# Patient Record
Sex: Female | Born: 1990 | Race: Black or African American | Hispanic: No | Marital: Single | Smoking: Never smoker
Health system: Southern US, Community
[De-identification: ages and names within clinical notes are randomized; demographics above are authoritative.]

## PROBLEM LIST (undated history)

## (undated) DIAGNOSIS — R87629 Unspecified abnormal cytological findings in specimens from vagina: Secondary | ICD-10-CM

## (undated) DIAGNOSIS — Z789 Other specified health status: Secondary | ICD-10-CM

## (undated) HISTORY — PX: NO PAST SURGERIES: SHX2092

## (undated) HISTORY — DX: Unspecified abnormal cytological findings in specimens from vagina: R87.629

## (undated) HISTORY — PX: INDUCED ABORTION: SHX677

---

## 2011-04-12 ENCOUNTER — Other Ambulatory Visit (HOSPITAL_COMMUNITY)
Admission: RE | Admit: 2011-04-12 | Discharge: 2011-04-12 | Disposition: A | Discharge status: Home / Self Care | Payer: Self-pay | Source: Ambulatory Visit | Attending: Family Medicine | Admitting: Family Medicine

## 2011-04-12 DIAGNOSIS — R87810 Cervical high risk human papillomavirus (HPV) DNA test positive: Secondary | ICD-10-CM | POA: Insufficient documentation

## 2011-04-12 DIAGNOSIS — Z01419 Encounter for gynecological examination (general) (routine) without abnormal findings: Secondary | ICD-10-CM | POA: Insufficient documentation

## 2013-08-22 ENCOUNTER — Encounter (HOSPITAL_COMMUNITY): Payer: Self-pay | Admitting: Emergency Medicine

## 2013-08-22 ENCOUNTER — Emergency Department (INDEPENDENT_AMBULATORY_CARE_PROVIDER_SITE_OTHER)
Admission: EM | Admit: 2013-08-22 | Discharge: 2013-08-22 | Disposition: A | Discharge status: Home / Self Care | Payer: Self-pay | Source: Home / Self Care | Attending: Emergency Medicine | Admitting: Emergency Medicine

## 2013-08-22 DIAGNOSIS — J039 Acute tonsillitis, unspecified: Secondary | ICD-10-CM

## 2013-08-22 LAB — POCT RAPID STREP A: Streptococcus, Group A Screen (Direct): NEGATIVE

## 2013-08-22 MED ORDER — AMOXICILLIN 500 MG PO CAPS: 500.0000 mg | ORAL_CAPSULE | Freq: Three times a day (TID) | ORAL | Status: DC

## 2013-08-22 NOTE — ED Provider Notes (Signed)
Chief Complaint:   Chief Complaint  Patient presents with  . Sore Throat    History of Present Illness:   Victoria Glass is a 22 year old female who has had a two-day history of sore throat, pain on swallowing, headache, subjective fever, sweats, and dry cough. She denies any nasal congestion, rhinorrhea, swollen glands, or GI symptoms. She's had no known exposure. She works as a Production assistant, radio.  Review of Systems:  Other than as noted above, the patient denies any of the following symptoms. Systemic:  No fever, chills, sweats, fatigue, myalgias, headache, or anorexia. Eye:  No redness, pain or drainage. ENT:  No earache, ear congestion, nasal congestion, sneezing, rhinorrhea, sinus pressure, sinus pain, or post nasal drip. Lungs:  No cough, sputum production, wheezing, shortness of breath, or chest pain. GI:  No abdominal pain, nausea, vomiting, or diarrhea. Skin:  No rash or itching.  PMFSH:  Past medical history, family history, social history, meds, allergies, and nurse's notes were reviewed.  There is no known exposure to strep or mono.  No prior history of step or mono.  The patient denies use of tobacco.   Physical Exam:   Vital signs:  BP 111/72  Pulse 89  Temp(Src) 100.2 F (37.9 C) (Oral)  Resp 21  SpO2 97%  LMP 08/22/2013 General:  Alert, in no distress. Eye:  No conjunctival injection or drainage. Lids were normal. ENT:  TMs and canals were normal, without erythema or inflammation.  Nasal mucosa was clear and uncongested, without drainage.  Mucous membranes were moist.  Exam of pharynx tonsils were moderately enlarged, red, and had some small spots of white exudate.  There were no oral ulcerations or lesions. There was no bulging of the tonsillar pillars, and the uvula was midline. Neck:  Supple, no adenopathy, tenderness or mass. Lungs:  No respiratory distress.  Lungs were clear to auscultation, without wheezes, rales or rhonchi.  Breath sounds were clear and equal bilaterally.   Heart:  Regular rhythm, without gallops, murmers or rubs. Skin:  Clear, warm, and dry, without rash or lesions.  Labs:   Results for orders placed during the hospital encounter of 08/22/13  POCT RAPID STREP A ([facility] CARE ONLY)      Result Value Range   Streptococcus, Group A Screen (Direct) NEGATIVE  NEGATIVE   Assessment:  The encounter diagnosis was Tonsillitis.  There is no evidence of a peritonsillar abscess.    Plan:   1.  Meds:  The following meds were prescribed:   Discharge Medication List as of 08/22/2013  7:19 PM    START taking these medications   Details  amoxicillin (AMOXIL) 500 MG capsule Take 1 capsule (500 mg total) by mouth 3 (three) times daily., Starting 08/22/2013, Until Discontinued, Normal        2.  Patient Education/Counseling:  The patient was given appropriate handouts, self care instructions, and instructed in symptomatic relief, including hot saline gargles, throat lozenges, infectious precautions, and need to trade out toothbrush.    3.  Follow up:  The patient was told to follow up if no better in 3 to 4 days, if becoming worse in any way, and given some red flag symptoms such as difficulty swallowing or breathing which would prompt immediate return.  Follow up here as necessary.     Reuben Likes, MD 08/22/13 2114

## 2013-08-22 NOTE — ED Notes (Signed)
Pt c/o sore throat onset yest Sxs include: odynophagia, dry cough, felt warm today Denies: v/n/d Alert w/no signs of acute distress.

## 2013-08-24 LAB — CULTURE, GROUP A STREP

## 2013-08-26 ENCOUNTER — Telehealth (HOSPITAL_COMMUNITY): Payer: Self-pay | Admitting: *Deleted

## 2013-08-26 NOTE — ED Notes (Signed)
Throat culture: strep beta hemolytic, not group A.  Pt. adequately treated with Amoxicillin. I called pt. Pt. verified x 2 and given results.  Pt. told she is adequately treated and to finish all of antibiotics. Vassie Moselle 08/26/2013

## 2015-11-28 ENCOUNTER — Telehealth: Payer: Self-pay | Admitting: *Deleted

## 2015-11-28 NOTE — Telephone Encounter (Signed)
Patient contacted the office stating she has had her Nexplanon in for 3 years and is interested in having it removed. Attempted to contact the patient and left message for patient to call the office.

## 2015-12-11 ENCOUNTER — Ambulatory Visit: Payer: Self-pay | Admitting: Certified Nurse Midwife

## 2015-12-12 ENCOUNTER — Ambulatory Visit: Payer: Self-pay | Admitting: Certified Nurse Midwife

## 2015-12-16 ENCOUNTER — Ambulatory Visit: Payer: Self-pay | Admitting: Certified Nurse Midwife

## 2015-12-18 ENCOUNTER — Ambulatory Visit: Payer: Self-pay | Admitting: Certified Nurse Midwife

## 2015-12-23 ENCOUNTER — Ambulatory Visit: Payer: Self-pay | Admitting: Certified Nurse Midwife

## 2015-12-26 ENCOUNTER — Ambulatory Visit: Payer: Self-pay | Admitting: Certified Nurse Midwife

## 2015-12-26 NOTE — Telephone Encounter (Signed)
Patient was originally scheduled for 12-26-15 for a Nexplanon Removal but was unable to keep this appointment due to her great grandfather being in the hospital. Patient has been rescheduled for 01-02-16.

## 2016-01-01 ENCOUNTER — Ambulatory Visit (INDEPENDENT_AMBULATORY_CARE_PROVIDER_SITE_OTHER): Payer: Managed Care, Other (non HMO) | Admitting: Certified Nurse Midwife

## 2016-01-01 ENCOUNTER — Encounter: Payer: Self-pay | Admitting: Certified Nurse Midwife

## 2016-01-01 VITALS — BP 113/72 | HR 81 | Temp 98.2°F | Wt 189.0 lb

## 2016-01-01 DIAGNOSIS — E669 Obesity, unspecified: Secondary | ICD-10-CM | POA: Insufficient documentation

## 2016-01-01 DIAGNOSIS — Z113 Encounter for screening for infections with a predominantly sexual mode of transmission: Secondary | ICD-10-CM

## 2016-01-01 DIAGNOSIS — Z01419 Encounter for gynecological examination (general) (routine) without abnormal findings: Secondary | ICD-10-CM

## 2016-01-01 NOTE — Progress Notes (Signed)
Patient ID: Victoria Glass, female   DOB: 05/16/1991, 25 y.o.   MRN: [id number]    Subjective:      Victoria Glass is a 25 y.o. female here for a routine exam.  Current complaints: none.  Has Nexplanon, desires removal was placed March 2014.  Desires to change birth control methods.  Is having irregular periods with Nexplanon lasting 7 days, denies any heavy bleeding or clots.  Desires to try [facility].  Currently sexually active.  Desires full STD screening.   Personal health questionnaire:  Is patient Ashkenazi Jewish, have a family history of breast and/or ovarian cancer: no Is there a family history of uterine cancer diagnosed at age < 48, gastrointestinal cancer, urinary tract cancer, family member who is a Personnel officer syndrome-associated carrier: no Is the patient overweight and hypertensive, family history of diabetes, personal history of gestational diabetes, preeclampsia or PCOS: yes Is patient over 64, have PCOS,  family history of premature CHD under age 108, diabetes, smoke, have hypertension or peripheral artery disease:  yes At any time, has a partner hit, kicked or otherwise hurt or frightened you?: no Over the past 2 weeks, have you felt down, depressed or hopeless?: no Over the past 2 weeks, have you felt little interest or pleasure in doing things?:no   Gynecologic History Patient's last menstrual period was 12/16/2015. Contraception: Nexplanon Last Pap: Unknown. Results were: normal according to the patient Last mammogram: N/A.   Obstetric History OB History  No data available    History reviewed. No pertinent past medical history.  History reviewed. No pertinent past surgical history.   Current outpatient prescriptions:  .  Etonogestrel (IMPLANON [place]), Inject into the skin., Disp: , Rfl:  Allergies  Allergen Reactions  . Citrus Other (See Comments)    Swelling of Tongue   . Parke Simmers Allergy]     Social History  Substance Use Topics  . Smoking status: Never  Smoker   . Smokeless tobacco: Not on file  . Alcohol Use: 0.0 oz/week    0 Standard drinks or equivalent per week     Comment: Socially     No family history on file.    Review of Systems  Constitutional: negative for fatigue and weight loss Respiratory: negative for cough and wheezing Cardiovascular: negative for chest pain, fatigue and palpitations Gastrointestinal: negative for abdominal pain and change in bowel habits Musculoskeletal:negative for myalgias Neurological: negative for gait problems and tremors Behavioral/Psych: negative for abusive relationship, depression Endocrine: negative for temperature intolerance   Genitourinary:negative for abnormal menstrual periods, genital lesions, hot flashes, sexual problems and vaginal discharge, +irregular bleeding with Nexplanon Integument/breast: negative for breast lump, breast tenderness, nipple discharge and skin lesion(s)    Objective:       BP 113/72 mmHg  Pulse 81  Temp(Src) 98.2 F (36.8 C)  Wt 189 lb (85.73 kg)  LMP 12/16/2015 General:   alert  Skin:   no rash or abnormalities  Lungs:   clear to auscultation bilaterally  Heart:   regular rate and rhythm, S1, S2 normal, no murmur, click, rub or gallop  Breasts:   normal without suspicious masses, skin or nipple changes or axillary nodes  Abdomen:  normal findings: no organomegaly, soft, non-tender and no hernia  Pelvis:  External genitalia: normal general appearance Urinary system: urethral meatus normal and bladder without fullness, nontender Vaginal: normal without tenderness, induration or masses Cervix: normal appearance Adnexa: normal bimanual exam Uterus: anteverted and non-tender, normal size   Lab Review Urine pregnancy  test Labs reviewed no Radiologic studies reviewed no  50% of 30 min visit spent on counseling and coordination of care.   Assessment:    Healthy female exam.   AUB with Nexplanon  Contraception management  STD screening  exam  Plan:    Education reviewed: calcium supplements, depression evaluation, low fat, low cholesterol diet, safe sex/STD prevention, self breast exams, skin cancer screening and weight bearing exercise. Contraception: Nexplanon. Follow up in: 1 day.   No orders of the defined types were placed in this encounter.   Orders Placed This Encounter  Procedures  . NuSwab Vaginitis Plus (VG+)  . HIV antibody (with reflex)  . Hepatitis B surface antigen  . RPR  . Hepatitis C antibody  . CBC with Differential/Platelet  . Comprehensive metabolic panel  . TSH  . HgB A1c  . HCV Antibody   Need to obtain previous records Possible management options include: another LARK Follow up in one month for string check from IUD insertion 01/02/16

## 2016-01-02 ENCOUNTER — Encounter: Payer: Self-pay | Admitting: Certified Nurse Midwife

## 2016-01-02 ENCOUNTER — Ambulatory Visit (INDEPENDENT_AMBULATORY_CARE_PROVIDER_SITE_OTHER): Payer: Managed Care, Other (non HMO) | Admitting: Certified Nurse Midwife

## 2016-01-02 VITALS — BP 104/66 | HR 76 | Temp 98.4°F | Wt 189.0 lb

## 2016-01-02 DIAGNOSIS — Z30430 Encounter for insertion of intrauterine contraceptive device: Secondary | ICD-10-CM

## 2016-01-02 DIAGNOSIS — Z3046 Encounter for surveillance of implantable subdermal contraceptive: Secondary | ICD-10-CM | POA: Insufficient documentation

## 2016-01-02 DIAGNOSIS — Z30014 Encounter for initial prescription of intrauterine contraceptive device: Secondary | ICD-10-CM | POA: Diagnosis not present

## 2016-01-02 DIAGNOSIS — Z3049 Encounter for surveillance of other contraceptives: Secondary | ICD-10-CM

## 2016-01-02 LAB — CBC WITH DIFFERENTIAL/PLATELET
BASOS ABS: 0 10*3/uL (ref 0.0–0.2)
BASOS: 1 %
EOS (ABSOLUTE): 0.1 10*3/uL (ref 0.0–0.4)
Eos: 2 %
HEMOGLOBIN: 13.7 g/dL (ref 11.1–15.9)
Hematocrit: 40.5 % (ref 34.0–46.6)
IMMATURE GRANS (ABS): 0 10*3/uL (ref 0.0–0.1)
Immature Granulocytes: 0 %
LYMPHS: 34 %
Lymphocytes Absolute: 1.7 10*3/uL (ref 0.7–3.1)
MCH: 30.6 pg (ref 26.6–33.0)
MCHC: 33.8 g/dL (ref 31.5–35.7)
MCV: 91 fL (ref 79–97)
MONOCYTES: 9 %
Monocytes Absolute: 0.5 10*3/uL (ref 0.1–0.9)
Neutrophils Absolute: 2.8 10*3/uL (ref 1.4–7.0)
Neutrophils: 54 %
Platelets: 289 10*3/uL (ref 150–379)
RBC: 4.47 x10E6/uL (ref 3.77–5.28)
RDW: 13.4 % (ref 12.3–15.4)
WBC: 5.2 10*3/uL (ref 3.4–10.8)

## 2016-01-02 LAB — COMPREHENSIVE METABOLIC PANEL
ALBUMIN: 4.1 g/dL (ref 3.5–5.5)
ALT: 22 IU/L (ref 0–32)
AST: 21 IU/L (ref 0–40)
Albumin/Globulin Ratio: 1.5 (ref 1.2–2.2)
Alkaline Phosphatase: 66 IU/L (ref 39–117)
BILIRUBIN TOTAL: 0.5 mg/dL (ref 0.0–1.2)
BUN/Creatinine Ratio: 13 (ref 8–20)
BUN: 11 mg/dL (ref 6–20)
CALCIUM: 9.2 mg/dL (ref 8.7–10.2)
CO2: 20 mmol/L (ref 18–29)
Chloride: 102 mmol/L (ref 96–106)
Creatinine, Ser: 0.85 mg/dL (ref 0.57–1.00)
GFR calc Af Amer: 111 mL/min/{1.73_m2} (ref >59)
GFR calc non Af Amer: 96 mL/min/{1.73_m2} (ref >59)
GLUCOSE: 86 mg/dL (ref 65–99)
Globulin, Total: 2.8 g/dL (ref 1.5–4.5)
POTASSIUM: 4.3 mmol/L (ref 3.5–5.2)
Sodium: 139 mmol/L (ref 134–144)
TOTAL PROTEIN: 6.9 g/dL (ref 6.0–8.5)

## 2016-01-02 LAB — HEMOGLOBIN A1C
ESTIMATED AVERAGE GLUCOSE: 100 mg/dL
Hgb A1c MFr Bld: 5.1 % (ref 4.8–5.6)

## 2016-01-02 LAB — HIV ANTIBODY (ROUTINE TESTING W REFLEX): HIV Screen 4th Generation wRfx: NONREACTIVE

## 2016-01-02 LAB — HCV ANTIBODY: HEP C VIRUS AB: 0.2 {s_co_ratio} (ref 0.0–0.9)

## 2016-01-02 LAB — HEPATITIS B SURFACE ANTIGEN: Hepatitis B Surface Ag: NEGATIVE

## 2016-01-02 LAB — TSH: TSH: 1.38 u[IU]/mL (ref 0.450–4.500)

## 2016-01-02 LAB — RPR: RPR Ser Ql: NONREACTIVE

## 2016-01-02 NOTE — Progress Notes (Signed)
Patient ID: Victoria Glass, female   DOB: 03-17-1991, 25 y.o.   MRN: [id number]  Procedure Note Removal of Nexplanon  Patient had Nexplano inserted in 2014. Desires removal today.   Reviewed risk and benefits of procedure. Alternative options discussed Patient reported understanding and agreed to continue.   The patient's left arm was palpated and the implant device located. The area was prepped with Hibiclens-Alcoholx3. The distal end of the device was palpated and 1 cc of 1% lidocaine with epinephrine was injected. A 2 mm incision was made. Any fibrotic tissue was carefully dissected away using blunt and/or sharp dissection. Over the tip and the tip was exposed, grasped with forcep and removed intact. Steri-strips and a sterile dressing were applied to the incision.   And a bandage applied and the arm was wrapped with gauze bandage.  The patient tolerated well.  Instructions:  The patient was instructed to remove the dressing in 24 hours and that some bruising is to be expected.  She was advised to use over the counter analgesics as needed for any pain at the site.  She is to keep the area dry for 24 hours and to call if her hand or arm becomes cold, numb, or blue.  Return visit:  Return in 4 weeks Patient plans IUD  Orvilla Cornwallachelle Denney CNM

## 2016-01-02 NOTE — Progress Notes (Signed)
Patient ID: Victoria Glass, female   DOB: July 07, 1991, 25 y.o.   MRN: [id number]  IUD Procedure Note   DIAGNOSIS: Desires long-term, reversible contraception   PROCEDURE: IUD placement Performing Provider: Orvilla Cornwallachelle Ruford Dudzinski CNM  Patient counseled prior to procedure. I explained risks and benefits of Kyleena IUD, reviewed alternative forms of contraception. Patient stated understanding and consented to continue with procedure.   LMP: unknown, Nexplanon Pregnancy Test: Negative Lot #: [id number] Expiration Date: March 2018   IUD type: [   ] Mirena   [   ] Paraguard  [   ] Skyla   [X]   Kyleena  PROCEDURE:  Timeout procedure was performed to ensure right patient and right site.  A bimanual exam was performed to determine the position of the uterus, retroverted. The speculum was placed. The vagina and cervix was sterilized in the usual manner and sterile technique was maintained throughout the course of the procedure. A single toothed tenaculum was applied to the posterior lip of the cervix and gentle traction applied. The depth of the uterus was sounded to 8 cm. With gentle traction on the tenaculum, the IUD was inserted to the appropriate depth and inserted without difficulty.  The string was cut to an estimated 4 cm length. Bleeding was minimal. The patient tolerated the procedure well.   Follow up: The patient tolerated the procedure well without complications.  Standard post-procedure care is explained and return precautions are given.  Orvilla Cornwallachelle Castella Lerner CNM

## 2016-01-04 LAB — NUSWAB VAGINITIS PLUS (VG+)
Atopobium vaginae: HIGH Score — AB
BVAB 2: HIGH Score — AB
CHLAMYDIA TRACHOMATIS, NAA: NEGATIVE
Candida albicans, NAA: NEGATIVE
Candida glabrata, NAA: NEGATIVE
Megasphaera 1: HIGH Score — AB
NEISSERIA GONORRHOEAE, NAA: NEGATIVE
Trich vag by NAA: NEGATIVE

## 2016-01-06 ENCOUNTER — Other Ambulatory Visit: Payer: Self-pay | Admitting: Certified Nurse Midwife

## 2016-01-06 DIAGNOSIS — B9689 Other specified bacterial agents as the cause of diseases classified elsewhere: Secondary | ICD-10-CM

## 2016-01-06 DIAGNOSIS — N760 Acute vaginitis: Principal | ICD-10-CM

## 2016-01-06 LAB — PAP IG W/ RFLX HPV ASCU: PAP Smear Comment: 0

## 2016-01-06 MED ORDER — METRONIDAZOLE 500 MG PO TABS: 500.0000 mg | ORAL_TABLET | Freq: Two times a day (BID) | ORAL | Status: DC

## 2016-02-03 ENCOUNTER — Ambulatory Visit: Payer: Managed Care, Other (non HMO) | Admitting: Certified Nurse Midwife

## 2017-08-22 ENCOUNTER — Ambulatory Visit (INDEPENDENT_AMBULATORY_CARE_PROVIDER_SITE_OTHER): Payer: 59 | Admitting: Certified Nurse Midwife

## 2017-08-22 ENCOUNTER — Encounter: Payer: Self-pay | Admitting: *Deleted

## 2017-08-22 ENCOUNTER — Encounter: Payer: Self-pay | Admitting: Certified Nurse Midwife

## 2017-08-22 ENCOUNTER — Other Ambulatory Visit: Payer: Self-pay

## 2017-08-22 VITALS — BP 111/68 | HR 81 | Ht 63.0 in | Wt 186.5 lb

## 2017-08-22 DIAGNOSIS — Z113 Encounter for screening for infections with a predominantly sexual mode of transmission: Secondary | ICD-10-CM | POA: Diagnosis not present

## 2017-08-22 DIAGNOSIS — Z124 Encounter for screening for malignant neoplasm of cervix: Secondary | ICD-10-CM

## 2017-08-22 DIAGNOSIS — Z30432 Encounter for removal of intrauterine contraceptive device: Secondary | ICD-10-CM | POA: Diagnosis not present

## 2017-08-22 NOTE — Progress Notes (Signed)
Presents for IUD removal, plans to use Condoms.

## 2017-08-22 NOTE — Progress Notes (Signed)
    GYNECOLOGY OFFICE PROCEDURE NOTE  Victoria Glass is a 26 y.o. No obstetric history on file. here for Liletta IUD removal. No GYN concerns.  Last pap smear was on 01/01/16 and was normal.  IUD Removal  Patient identified, informed consent performed, consent signed.  Patient was in the dorsal lithotomy position, normal external genitalia was noted.  A speculum was placed in the patient's vagina, normal discharge was noted, no lesions. The cervix was visualized, no lesions, no abnormal discharge.  The strings of the IUD were grasped and pulled using ring forceps. The IUD was removed in its entirety. Kyleena.  Patient tolerated the procedure well.    Patient will use condoms for contraception/ does not plan for pregnancy encouraged to, take PNV and folic acid.  Routine preventative health maintenance measures emphasized.  STD testing and pap smear obtained today.    Orvilla Cornwallachelle Denney, CNM [facility]

## 2017-08-22 NOTE — Addendum Note (Signed)
Addended by: Dalphine HandingGARDNER, Esdras Delair L on: 08/22/2017 04:49 PM   Modules accepted: Orders

## 2017-08-23 LAB — CERVICOVAGINAL ANCILLARY ONLY
Bacterial vaginitis: POSITIVE — AB
Candida vaginitis: NEGATIVE
Chlamydia: NEGATIVE
Neisseria Gonorrhea: NEGATIVE
TRICH (WINDOWPATH): NEGATIVE

## 2017-08-24 LAB — CYTOLOGY - PAP

## 2017-08-29 ENCOUNTER — Other Ambulatory Visit: Payer: Self-pay | Admitting: Certified Nurse Midwife

## 2017-08-29 DIAGNOSIS — R87612 Low grade squamous intraepithelial lesion on cytologic smear of cervix (LGSIL): Secondary | ICD-10-CM

## 2017-08-29 DIAGNOSIS — B9689 Other specified bacterial agents as the cause of diseases classified elsewhere: Secondary | ICD-10-CM

## 2017-08-29 DIAGNOSIS — N760 Acute vaginitis: Principal | ICD-10-CM

## 2017-08-29 MED ORDER — METRONIDAZOLE 500 MG PO TABS: 500.0000 mg | ORAL_TABLET | Freq: Two times a day (BID) | ORAL | 0 refills | Status: DC

## 2018-10-03 ENCOUNTER — Emergency Department (HOSPITAL_COMMUNITY)
Admission: EM | Admit: 2018-10-03 | Discharge: 2018-10-03 | Disposition: A | Discharge status: Home / Self Care | Payer: BLUE CROSS/BLUE SHIELD | Attending: Emergency Medicine | Admitting: Emergency Medicine

## 2018-10-03 ENCOUNTER — Encounter (HOSPITAL_COMMUNITY): Payer: Self-pay | Admitting: *Deleted

## 2018-10-03 ENCOUNTER — Other Ambulatory Visit: Payer: Self-pay

## 2018-10-03 DIAGNOSIS — K047 Periapical abscess without sinus: Secondary | ICD-10-CM | POA: Insufficient documentation

## 2018-10-03 DIAGNOSIS — O9989 Other specified diseases and conditions complicating pregnancy, childbirth and the puerperium: Secondary | ICD-10-CM | POA: Diagnosis not present

## 2018-10-03 DIAGNOSIS — Z3A01 Less than 8 weeks gestation of pregnancy: Secondary | ICD-10-CM | POA: Diagnosis not present

## 2018-10-03 MED ORDER — AMOXICILLIN 500 MG PO CAPS
500.0000 mg | ORAL_CAPSULE | Freq: Once | ORAL | Status: AC
  Administered 2018-10-03: 500 mg via ORAL
  Filled 2018-10-03: qty 1

## 2018-10-03 MED ORDER — AMOXICILLIN 500 MG PO CAPS: 500.0000 mg | ORAL_CAPSULE | Freq: Three times a day (TID) | ORAL | 0 refills | Status: DC

## 2018-10-03 NOTE — ED Provider Notes (Signed)
[facility]-EMERGENCY DEPT Provider Note   CSN: [id number] Arrival date & time: 10/03/18  1918     History   Chief Complaint Chief Complaint  Patient presents with  . Dental Pain    HPI Victoria Glass is a 27 y.o. female who presents to the ED with dental pain. Patient reports she is about [redacted] weeks pregnant.   HPI  History reviewed. No pertinent past medical history.  Patient Active Problem List   Diagnosis Date Noted  . Low grade squamous intraepith lesion on cytologic smear cervix (lgsil) 08/29/2017  . Obese 01/01/2016  . Well woman exam 01/01/2016    History reviewed. No pertinent surgical history.   OB History    Gravida  1   Para      Term      Preterm      AB      Living        SAB      TAB      Ectopic      Multiple      Live Births               Home Medications    Prior to Admission medications   Medication Sig Start Date End Date Taking? Authorizing Provider  acetaminophen (TYLENOL) 500 MG tablet Take 500 mg by mouth every 6 (six) hours as needed for mild pain or moderate pain.   Yes [provider]  benzocaine (HURRICAINE) 20 % GEL Use as directed 1 application in the mouth or throat 3 (three) times daily as needed (pain).   Yes [provider]  ibuprofen (ADVIL,MOTRIN) 200 MG tablet Take 400 mg by mouth every 6 (six) hours as needed for mild pain or moderate pain.   Yes [provider]  amoxicillin (AMOXIL) 500 MG capsule Take 1 capsule (500 mg total) by mouth 3 (three) times daily. 10/03/18   Janne NapoleonNeese, Corazon Nickolas M, NP    Family History No family history on file.  Social History Social History   Tobacco Use  . Smoking status: Never Smoker  . Smokeless tobacco: Never Used  Substance Use Topics  . Alcohol use: Yes    Alcohol/week: 0.0 standard drinks    Comment: Socially   . Drug use: Not Currently    Types: Marijuana    Comment: occassionally     Allergies   Patient has no  known allergies.   Review of Systems Review of Systems  Constitutional: Negative for fever.  HENT: Positive for dental problem and facial swelling. Negative for trouble swallowing.   Genitourinary:       Pregnant  All other systems reviewed and are negative.    Physical Exam Updated Vital Signs BP 133/70 (BP Location: Left Arm)   Pulse 93   Temp 98.9 F (37.2 C) (Oral)   Resp 18   Ht 5\' 2"  (1.575 m)   Wt 81.2 kg   LMP 08/23/2018 (Exact Date)   SpO2 100%   BMI 32.74 kg/m   Physical Exam Vitals signs and nursing note reviewed.  Constitutional:      General: She is not in acute distress.    Appearance: She is well-developed.  HENT:     Head: Normocephalic.     Right Ear: Tympanic membrane normal.     Left Ear: Tympanic membrane normal.     Nose: Nose normal.     Mouth/Throat:     Mouth: Mucous membranes are moist.     Dentition: Dental  tenderness present.      Comments: Left lower first molar tender on exam, there is swelling and erythema of the gum surrounding the tooth.  Eyes:     Conjunctiva/sclera: Conjunctivae normal.  Neck:     Musculoskeletal: Neck supple.  Cardiovascular:     Rate and Rhythm: Normal rate.  Pulmonary:     Effort: Pulmonary effort is normal.  Musculoskeletal: Normal range of motion.  Lymphadenopathy:     Cervical: Cervical adenopathy present.  Skin:    General: Skin is warm and dry.  Neurological:     Mental Status: She is alert and oriented to person, place, and time.  Psychiatric:        Mood and Affect: Mood normal.      ED Treatments / Results  Labs (all labs ordered are listed, but only abnormal results are displayed) Labs Reviewed - No data to display  Radiology No results found.  Procedures Procedures (including critical care time)  Medications Ordered in ED Medications  amoxicillin (AMOXIL) capsule 500 mg (500 mg Oral Given 10/03/18 2019)     Initial Impression / Assessment and Plan / ED Course  I have  reviewed the triage vital signs and the nursing notes. Patient with toothache.  No gross abscess.  Exam unconcerning for Ludwig's angina or spread of infection.  Will treat with Amoxil. Urged patient to follow-up with dentist.  Instructed patient to take the antibiotics and tylenol only since she is pregnant.  Final Clinical Impressions(s) / ED Diagnoses   Final diagnoses:  Dental infection    ED Discharge Orders         Ordered    amoxicillin (AMOXIL) 500 MG capsule  3 times daily     10/03/18 2009           Kerrie Buffaloeese, Anisa Leanos [place], [place] 10/03/18 2023    Terrilee FilesButler, Michael C, MD 10/04/18 1320

## 2018-10-03 NOTE — Discharge Instructions (Signed)
Take the antibiotic for infection and take tylenol as needed for pain. Call the OB office to schedule an appointment for pregnancy conformation and to start prenatal care.

## 2018-10-03 NOTE — ED Triage Notes (Signed)
Pt c/o left lower mouth pain x 2 days.  No decay noted to left lower back teeth.

## 2018-10-06 ENCOUNTER — Ambulatory Visit (INDEPENDENT_AMBULATORY_CARE_PROVIDER_SITE_OTHER): Payer: BLUE CROSS/BLUE SHIELD

## 2018-10-06 VITALS — BP 125/73 | HR 100 | Ht 63.0 in | Wt 179.2 lb

## 2018-10-06 DIAGNOSIS — Z3201 Encounter for pregnancy test, result positive: Secondary | ICD-10-CM

## 2018-10-06 LAB — POCT URINE PREGNANCY: Preg Test, Ur: POSITIVE — AB

## 2018-10-06 NOTE — Progress Notes (Signed)
[name] presents today for UPT. She has no unusual complaints.  LMP:08/23/18  EDD: 05/30/2019    OBJECTIVE: Appears well, in no apparent distress.  OB History    Gravida  1   Para      Term      Preterm      AB      Living        SAB      TAB      Ectopic      Multiple      Live Births             Home UPT Result: POSITIVE  In-Office UPT result: POSITIVE  I have reviewed the patient's medical, obstetrical, social, and family histories, and medications.   ASSESSMENT: Positive pregnancy test  PLAN Prenatal care to be completed at: [facility]

## 2018-11-02 ENCOUNTER — Ambulatory Visit (INDEPENDENT_AMBULATORY_CARE_PROVIDER_SITE_OTHER): Payer: BLUE CROSS/BLUE SHIELD | Admitting: Certified Nurse Midwife

## 2018-11-02 ENCOUNTER — Encounter: Payer: Self-pay | Admitting: Certified Nurse Midwife

## 2018-11-02 VITALS — BP 124/69 | HR 97 | Wt 182.0 lb

## 2018-11-02 DIAGNOSIS — Z113 Encounter for screening for infections with a predominantly sexual mode of transmission: Secondary | ICD-10-CM

## 2018-11-02 DIAGNOSIS — Z124 Encounter for screening for malignant neoplasm of cervix: Secondary | ICD-10-CM

## 2018-11-02 DIAGNOSIS — O99210 Obesity complicating pregnancy, unspecified trimester: Secondary | ICD-10-CM

## 2018-11-02 DIAGNOSIS — Z23 Encounter for immunization: Secondary | ICD-10-CM | POA: Diagnosis not present

## 2018-11-02 DIAGNOSIS — Z3481 Encounter for supervision of other normal pregnancy, first trimester: Secondary | ICD-10-CM | POA: Diagnosis not present

## 2018-11-02 DIAGNOSIS — Z3480 Encounter for supervision of other normal pregnancy, unspecified trimester: Secondary | ICD-10-CM

## 2018-11-02 DIAGNOSIS — O99211 Obesity complicating pregnancy, first trimester: Secondary | ICD-10-CM

## 2018-11-02 NOTE — Patient Instructions (Signed)
Influenza (Flu) Vaccine (Inactivated or Recombinant): What You Need to Know  1. Why get vaccinated?  Influenza vaccine can prevent influenza (flu).  Flu is a contagious disease that spreads around the [place] every year, usually between October and May. Anyone can get the flu, but it is more dangerous for some people. Infants and young children, people 28 years of age and older, pregnant women, and people with certain health conditions or a weakened immune system are at greatest risk of flu complications.  Pneumonia, bronchitis, sinus infections and ear infections are examples of flu-related complications. If you have a medical condition, such as heart disease, cancer or diabetes, flu can make it worse.  Flu can cause fever and chills, sore throat, muscle aches, fatigue, cough, headache, and runny or stuffy nose. Some people may have vomiting and diarrhea, though this is more common in children than adults.  Each year thousands of people in the [place] die from flu, and many more are hospitalized. Flu vaccine prevents millions of illnesses and flu-related visits to the doctor each year.  2. Influenza vaccine  CDC recommends everyone 6 months of age and older get vaccinated every flu season. Children 6 months through 8 years of age may need 2 doses during a single flu season. Everyone else needs only 1 dose each flu season.  It takes about 2 weeks for protection to develop after vaccination.  There are many flu viruses, and they are always changing. Each year a new flu vaccine is made to protect against three or four viruses that are likely to cause disease in the upcoming flu season. Even when the vaccine doesn't exactly match these viruses, it may still provide some protection.  Influenza vaccine does not cause flu.  Influenza vaccine may be given at the same time as other vaccines.  3. Talk with your health care provider  Tell your vaccine provider if the person getting the vaccine:  · Has had an  allergic reaction after a previous dose of influenza vaccine, or has any severe, life-threatening allergies.  · Has ever had Guillain-Barré Syndrome (also called GBS).  In some cases, your health care provider may decide to postpone influenza vaccination to a future visit.  People with minor illnesses, such as a cold, may be vaccinated. People who are moderately or severely ill should usually wait until they recover before getting influenza vaccine.  Your health care provider can give you more information.  4. Risks of a vaccine reaction  · Soreness, redness, and swelling where shot is given, fever, muscle aches, and headache can happen after influenza vaccine.  · There may be a very small increased risk of Guillain-Barré Syndrome (GBS) after inactivated influenza vaccine (the flu shot).  Young children who get the flu shot along with pneumococcal vaccine (PCV13), and/or DTaP vaccine at the same time might be slightly more likely to have a seizure caused by fever. Tell your health care provider if a child who is getting flu vaccine has ever had a seizure.  People sometimes faint after medical procedures, including vaccination. Tell your provider if you feel dizzy or have vision changes or ringing in the ears.  As with any medicine, there is a very remote chance of a vaccine causing a severe allergic reaction, other serious injury, or death.  5. What if there is a serious problem?  An allergic reaction could occur after the vaccinated person leaves the clinic. If you see signs of a severe allergic reaction (hives, swelling   of the face and throat, difficulty breathing, a fast heartbeat, dizziness, or weakness), call 9-1-1 and get the person to the nearest hospital.  For other signs that concern you, call your health care provider.  Adverse reactions should be reported to the Vaccine Adverse Event Reporting System (VAERS). Your health care provider will usually file this report, or you can do it yourself. Visit the  VAERS website at www.vaers.hhs.gov or call 1-800-822-7967.VAERS is only for reporting reactions, and VAERS staff do not give medical advice.  6. The National Vaccine Injury Compensation Program  The National Vaccine Injury Compensation Program (VICP) is a federal program that was created to compensate people who may have been injured by certain vaccines. Visit the VICP website at www.hrsa.gov/vaccinecompensation or call 1-800-338-2382 to learn about the program and about filing a claim. There is a time limit to file a claim for compensation.  7. How can I learn more?  · Ask your healthcare provider.  · Call your local or state health department.  · Contact the Centers for Disease Control and Prevention (CDC):  ? Call 1-800-232-4636 (1-800-CDC-INFO) or  ? Visit CDC's www.cdc.gov/flu  Vaccine Information Statement (Interim) Inactivated Influenza Vaccine (05/25/2018)  This information is not intended to replace advice given to you by your health care provider. Make sure you discuss any questions you have with your health care provider.  Document Released: 07/22/2006 Document Revised: 05/29/2018 Document Reviewed: 05/29/2018  Elsevier Interactive Patient Education © 2019 [employer]

## 2018-11-02 NOTE — Progress Notes (Signed)
Subjective:   Victoria Glass is a 28 y.o. G2P0010 at 707w1d by LMP being seen today for her first obstetrical visit.  Her obstetrical history is significant for obesity. Patient does intend to breast feed. Pregnancy history fully reviewed.  Patient reports no complaints.  HISTORY: OB History  Gravida Para Term Preterm AB Living  2 0 0 0 1 0  SAB TAB Ectopic Multiple Live Births  0 1 0 0 0    # Outcome Date GA Lbr Len/2nd Weight Sex Delivery Anes PTL Lv  2 Current           1 TAB             Last pap smear was done 08/22/2017 and was abnormal - LSIL/+HPV  Past Medical History:  Diagnosis Date  . Vaginal Pap smear, abnormal    History reviewed. No pertinent surgical history. Family History  Problem Relation Age of Onset  . Drug abuse Maternal Aunt   . Alcohol abuse Maternal Uncle   . Hypertension Maternal Uncle   . Drug abuse Paternal Aunt   . Diabetes Maternal Grandmother   . Cancer Maternal Grandfather   . Heart disease Maternal Grandfather   . Hypertension Maternal Grandfather    Social History   Tobacco Use  . Smoking status: Never Smoker  . Smokeless tobacco: Never Used  Substance Use Topics  . Alcohol use: Yes    Alcohol/week: 0.0 standard drinks    Comment: occasionally  . Drug use: Not Currently    Types: Marijuana    Comment: last time   - 4 months ago   No Known Allergies Current Outpatient Medications on File Prior to Visit  Medication Sig Dispense Refill  . acetaminophen (TYLENOL) 500 MG tablet Take 500 mg by mouth every 6 (six) hours as needed for mild pain or moderate pain.    . Prenatal MV-Min-FA-Omega-3 (PRENATAL GUMMIES/DHA & FA) 0.4-32.5 MG CHEW Chew by mouth.    Marland Kitchen. ibuprofen (ADVIL,MOTRIN) 200 MG tablet Take 400 mg by mouth every 6 (six) hours as needed for mild pain or moderate pain.     No current facility-administered medications on file prior to visit.     Review of Systems Pertinent items noted in HPI and remainder of  comprehensive ROS otherwise negative.  Exam   Vitals:   11/02/18 1008  BP: 124/69  Pulse: 97  Weight: 182 lb (82.6 kg)   Fetal Heart Rate (bpm): 150  Uterus:   Uterus measuring above pubis, size of grapefruit   Pelvic Exam: Perineum: no hemorrhoids, normal perineum   Vulva: normal external genitalia, no lesions   Vagina:  normal mucosa, normal discharge   Cervix: no lesions and normal, pap smear done.    Adnexa: normal adnexa and no mass, fullness, tenderness   Bony Pelvis: average  System: General: well-developed, well-nourished female in no acute distress   Skin: normal coloration and turgor, no rashes   Neurologic: oriented, normal, negative, normal mood   Extremities: normal strength, tone, and muscle mass, ROM of all joints is normal   HEENT PERRLA, extraocular movement intact and sclera clear   Mouth/Teeth mucous membranes moist, pharynx normal without lesions and dental hygiene good   Neck supple and no masses   Cardiovascular: regular rate and rhythm   Respiratory:  no respiratory distress, normal breath sounds   Abdomen: soft, non-tender; bowel sounds normal; no masses,  no organomegaly   Bedside Ultrasound for FHR check: Patient informed that the ultrasound  is considered a limited obstetric ultrasound and is not intended to be a complete ultrasound exam.  Patient also informed that the ultrasound is not being completed with the intent of assessing for fetal or placental anomalies or any pelvic abnormalities.  Explained that the purpose of today's ultrasound is to assess for fetal heart rate.  Patient acknowledges the purpose of the exam and the limitations of the study.    FHR 150 by bedside US, ? Multiple gestation- family hx of twin gestation. Separated membrane seen by [place] on examination but unable to see second fetus.  Assessment:   Pregnancy: G2P0010 Patient Active Problem List   Diagnosis Date Noted  . Supervision of other normal pregnancy, antepartum 11/02/2018    . Obesity during pregnancy, antepartum 11/02/2018  . Low grade squamous intraepith lesion on cytologic smear cervix (lgsil) 08/29/2017  . Obese 01/01/2016  . Well woman exam 01/01/2016     Plan:  1. Supervision of other normal pregnancy, antepartum - Dating [place] (transvaginal) scheduled for possible multiple gestation or uterus abnormalities  - Routine prenatal care and anticipatory guidance - Culture, OB Urine - Hemoglobinopathy evaluation - Obstetric Panel, Including HIV - Cytology - PAP( [place]) - Prenatal MV-Min-FA-Omega-3 (PRENATAL GUMMIES/DHA & FA) 0.4-32.5 MG CHEW; Chew by mouth. - Genetic Screening - HgB A1c - US OB LESS THAN 14 WEEKS WITH OB TRANSVAGINAL; Future  2. Need for immunization against influenza - Flu Vaccine QUAD 36+ mos IM  3. Obesity during pregnancy, antepartum BMI 32.2  - early A1C obtained  - needs to start BASA at next prenatal appointment    Initial labs drawn. Continue prenatal vitamins. Genetic Screening discussed, NIPS: ordered. Ultrasound discussed; fetal anatomic survey: requested. Problem list reviewed and updated. The nature of  - [facility] with multiple MDs and other Advanced Practice Providers was explained to patient; also emphasized that residents, students are part of our team. Routine obstetric precautions reviewed. Return in about 4 weeks (around 11/30/2018) for ROB.    Sharyon Cable, CNM [facility] [employer], [facility]

## 2018-11-05 LAB — URINE CULTURE, OB REFLEX

## 2018-11-05 LAB — CULTURE, OB URINE

## 2018-11-07 LAB — CYTOLOGY - PAP
Chlamydia: NEGATIVE
Diagnosis: NEGATIVE
Neisseria Gonorrhea: NEGATIVE
Trichomonas: NEGATIVE

## 2018-11-07 LAB — OBSTETRIC PANEL, INCLUDING HIV
Antibody Screen: NEGATIVE
Basophils Absolute: 0 10*3/uL (ref 0.0–0.2)
Basos: 1 %
EOS (ABSOLUTE): 0.1 10*3/uL (ref 0.0–0.4)
Eos: 1 %
HIV Screen 4th Generation wRfx: NONREACTIVE
Hematocrit: 36.5 % (ref 34.0–46.6)
Hemoglobin: 12.6 g/dL (ref 11.1–15.9)
Hepatitis B Surface Ag: NEGATIVE
Immature Grans (Abs): 0 10*3/uL (ref 0.0–0.1)
Immature Granulocytes: 0 %
Lymphocytes Absolute: 1.6 10*3/uL (ref 0.7–3.1)
Lymphs: 32 %
MCH: 31.3 pg (ref 26.6–33.0)
MCHC: 34.5 g/dL (ref 31.5–35.7)
MCV: 91 fL (ref 79–97)
Monocytes Absolute: 0.5 10*3/uL (ref 0.1–0.9)
Monocytes: 9 %
Neutrophils Absolute: 3 10*3/uL (ref 1.4–7.0)
Neutrophils: 57 %
Platelets: 301 10*3/uL (ref 150–450)
RBC: 4.02 x10E6/uL (ref 3.77–5.28)
RDW: 12.6 % (ref 11.7–15.4)
RPR Ser Ql: NONREACTIVE
Rh Factor: POSITIVE
Rubella Antibodies, IGG: 4.04 index (ref >0.99)
WBC: 5.2 10*3/uL (ref 3.4–10.8)

## 2018-11-07 LAB — HEMOGLOBIN A1C
Est. average glucose Bld gHb Est-mCnc: 82 mg/dL
Hgb A1c MFr Bld: 4.5 % — ABNORMAL LOW (ref 4.8–5.6)

## 2018-11-07 LAB — HEMOGLOBINOPATHY EVALUATION
HGB C: 0 %
HGB S: 0 %
HGB VARIANT: 0 %
Hemoglobin A2 Quantitation: 2.6 % (ref 1.8–3.2)
Hemoglobin F Quantitation: 0 % (ref 0.0–2.0)
Hgb A: 97.4 % (ref 96.4–98.8)

## 2018-11-08 ENCOUNTER — Ambulatory Visit (HOSPITAL_COMMUNITY)
Admission: RE | Admit: 2018-11-08 | Discharge: 2018-11-08 | Disposition: A | Discharge status: Home / Self Care | Payer: BLUE CROSS/BLUE SHIELD | Source: Ambulatory Visit | Attending: Certified Nurse Midwife | Admitting: Certified Nurse Midwife

## 2018-11-08 ENCOUNTER — Other Ambulatory Visit: Payer: Self-pay | Admitting: Certified Nurse Midwife

## 2018-11-08 DIAGNOSIS — Z3480 Encounter for supervision of other normal pregnancy, unspecified trimester: Secondary | ICD-10-CM | POA: Diagnosis present

## 2018-11-30 ENCOUNTER — Ambulatory Visit (INDEPENDENT_AMBULATORY_CARE_PROVIDER_SITE_OTHER): Payer: BLUE CROSS/BLUE SHIELD | Admitting: Certified Nurse Midwife

## 2018-11-30 VITALS — BP 126/76 | HR 77 | Wt 182.1 lb

## 2018-11-30 DIAGNOSIS — O99212 Obesity complicating pregnancy, second trimester: Secondary | ICD-10-CM

## 2018-11-30 DIAGNOSIS — Z3A14 14 weeks gestation of pregnancy: Secondary | ICD-10-CM

## 2018-11-30 DIAGNOSIS — O99210 Obesity complicating pregnancy, unspecified trimester: Secondary | ICD-10-CM

## 2018-11-30 DIAGNOSIS — Z3480 Encounter for supervision of other normal pregnancy, unspecified trimester: Secondary | ICD-10-CM

## 2018-11-30 DIAGNOSIS — Z3482 Encounter for supervision of other normal pregnancy, second trimester: Secondary | ICD-10-CM

## 2018-11-30 NOTE — Progress Notes (Signed)
Patient reports feeling fetal flutter movements, denies pain. 

## 2018-12-01 NOTE — Progress Notes (Signed)
   PRENATAL VISIT NOTE  Subjective:  Victoria Glass is a 28 y.o. G2P0010 at [redacted]w[redacted]d being seen today for ongoing prenatal care.  She is currently monitored for the following issues for this low-risk pregnancy and has Obese; Well woman exam; Low grade squamous intraepith lesion on cytologic smear cervix (lgsil); Supervision of other normal pregnancy, antepartum; and Obesity during pregnancy, antepartum on their problem list.  Patient reports no complaints.  Contractions: Not present. Vag. Bleeding: None.  Movement: Present. Denies leaking of fluid.   The following portions of the patient's history were reviewed and updated as appropriate: allergies, current medications, past family history, past medical history, past social history, past surgical history and problem list. Problem list updated.  Objective:  Vitals:   11/30/18 1000  BP: 126/76  Pulse: 77  Weight: 182 lb 1.6 oz (82.6 kg)    Fetal Status: Fetal Heart Rate (bpm): 151   Movement: Present     General:  Alert, oriented and cooperative. Patient is in no acute distress.  Skin: Skin is warm and dry. No rash noted.   Cardiovascular: Normal heart rate noted  Respiratory: Normal respiratory effort, no problems with respiration noted  Abdomen: Soft, gravid, appropriate for gestational age.  Pain/Pressure: Absent     Pelvic: Cervical exam deferred        Extremities: Normal range of motion.  Edema: None  Mental Status: Normal mood and affect. Normal behavior. Normal judgment and thought content.   Assessment and Plan:  Pregnancy: G2P0010 at [redacted]w[redacted]d  1. Supervision of other normal pregnancy, antepartum - Patient doing well, no complaints  - Routine prenatal care  - Anticipatory guidance on upcoming appointments with ultrasound around [redacted] weeks gestation  - [place] MFM OB COMP + 14 WK; Future  2. Obesity during pregnancy, antepartum - Continue BASA and PNV as prescribed  - Educated and discussed recommendation on weight gain during  pregnancy   Preterm labor symptoms and general obstetric precautions including but not limited to vaginal bleeding, contractions, leaking of fluid and fetal movement were reviewed in detail with the patient. Please refer to After Visit Summary for other counseling recommendations.  Return in about 4 weeks (around 12/28/2018) for ROB.  Future Appointments  Date Time Provider Department Center  12/14/2018 11:15 AM Sharyon Cable, CNM [facility] None  01/03/2019  8:45 AM [facility] [place] 2 [facility]    Sharyon Cable, CNM

## 2018-12-14 ENCOUNTER — Encounter: Payer: BLUE CROSS/BLUE SHIELD | Admitting: Certified Nurse Midwife

## 2018-12-26 ENCOUNTER — Encounter (HOSPITAL_COMMUNITY): Payer: Self-pay

## 2018-12-28 ENCOUNTER — Ambulatory Visit (INDEPENDENT_AMBULATORY_CARE_PROVIDER_SITE_OTHER): Payer: BLUE CROSS/BLUE SHIELD | Admitting: Certified Nurse Midwife

## 2018-12-28 ENCOUNTER — Encounter: Payer: Self-pay | Admitting: Certified Nurse Midwife

## 2018-12-28 ENCOUNTER — Other Ambulatory Visit: Payer: Self-pay

## 2018-12-28 VITALS — BP 111/71 | HR 89 | Wt 187.8 lb

## 2018-12-28 DIAGNOSIS — O99210 Obesity complicating pregnancy, unspecified trimester: Secondary | ICD-10-CM

## 2018-12-28 DIAGNOSIS — Z3480 Encounter for supervision of other normal pregnancy, unspecified trimester: Secondary | ICD-10-CM

## 2018-12-28 DIAGNOSIS — O99212 Obesity complicating pregnancy, second trimester: Secondary | ICD-10-CM

## 2018-12-28 DIAGNOSIS — Z3A18 18 weeks gestation of pregnancy: Secondary | ICD-10-CM

## 2018-12-28 NOTE — Progress Notes (Signed)
   PRENATAL VISIT NOTE  Subjective:  Victoria Glass is a 28 y.o. G2P0010 at [redacted]w[redacted]d being seen today for ongoing prenatal care.  She is currently monitored for the following issues for this low-risk pregnancy and has Obese; Well woman exam; Low grade squamous intraepith lesion on cytologic smear cervix (lgsil); Supervision of other normal pregnancy, antepartum; and Obesity during pregnancy, antepartum on their problem list.  Patient reports no complaints.  Contractions: Not present. Vag. Bleeding: None.  Movement: Present. Denies leaking of fluid.   The following portions of the patient's history were reviewed and updated as appropriate: allergies, current medications, past family history, past medical history, past social history, past surgical history and problem list.   Objective:  Vitals:   12/28/18 1116  BP: 111/71  Pulse: 89  Weight: 187 lb 12.8 oz (85.2 kg)    Fetal Status: Fetal Heart Rate (bpm): 150   Movement: Present     General:  Alert, oriented and cooperative. Patient is in no acute distress.  Skin: Skin is warm and dry. No rash noted.   Cardiovascular: Normal heart rate noted  Respiratory: Normal respiratory effort, no problems with respiration noted  Abdomen: Soft, gravid, appropriate for gestational age.  Pain/Pressure: Absent     Pelvic: Cervical exam deferred        Extremities: Normal range of motion.  Edema: None  Mental Status: Normal mood and affect. Normal behavior. Normal judgment and thought content.   Assessment and Plan:  Pregnancy: G2P0010 at [redacted]w[redacted]d 1. Supervision of other normal pregnancy, antepartum - Patient doing well, no complaints  - Recently married and had gender reveal  - Anticipatory guidance on upcoming appointments  - Anatomy US scheduled for next week   2. Obesity during pregnancy, antepartum - Discussed recommendation of weight gain and exercise during pregnancy   Preterm labor symptoms and general obstetric precautions including but  not limited to vaginal bleeding, contractions, leaking of fluid and fetal movement were reviewed in detail with the patient. Please refer to After Visit Summary for other counseling recommendations.   Return in about 4 weeks (around 01/25/2019) for ROB.  Future Appointments  Date Time Provider Department Center  01/03/2019  8:45 AM [facility] [place] 2 [facility]  01/31/2019  9:30 AM Sharyon Cable, CNM [facility] None    Sharyon Cable, CNM

## 2018-12-28 NOTE — Patient Instructions (Signed)

## 2019-01-03 ENCOUNTER — Other Ambulatory Visit: Payer: Self-pay

## 2019-01-03 ENCOUNTER — Ambulatory Visit (HOSPITAL_COMMUNITY)
Admission: RE | Admit: 2019-01-03 | Discharge: 2019-01-03 | Disposition: A | Discharge status: Home / Self Care | Payer: BLUE CROSS/BLUE SHIELD | Source: Ambulatory Visit | Attending: Certified Nurse Midwife | Admitting: Certified Nurse Midwife

## 2019-01-03 DIAGNOSIS — Z363 Encounter for antenatal screening for malformations: Secondary | ICD-10-CM

## 2019-01-03 DIAGNOSIS — Z3A19 19 weeks gestation of pregnancy: Secondary | ICD-10-CM

## 2019-01-03 DIAGNOSIS — Z3480 Encounter for supervision of other normal pregnancy, unspecified trimester: Secondary | ICD-10-CM | POA: Diagnosis present

## 2019-01-31 ENCOUNTER — Ambulatory Visit (INDEPENDENT_AMBULATORY_CARE_PROVIDER_SITE_OTHER): Payer: BLUE CROSS/BLUE SHIELD | Admitting: Certified Nurse Midwife

## 2019-01-31 ENCOUNTER — Encounter: Payer: Self-pay | Admitting: Certified Nurse Midwife

## 2019-01-31 ENCOUNTER — Other Ambulatory Visit: Payer: Self-pay

## 2019-01-31 VITALS — BP 110/73 | HR 88 | Temp 98.4°F | Wt 192.0 lb

## 2019-01-31 DIAGNOSIS — O99212 Obesity complicating pregnancy, second trimester: Secondary | ICD-10-CM

## 2019-01-31 DIAGNOSIS — Z3480 Encounter for supervision of other normal pregnancy, unspecified trimester: Secondary | ICD-10-CM

## 2019-01-31 DIAGNOSIS — O99210 Obesity complicating pregnancy, unspecified trimester: Secondary | ICD-10-CM

## 2019-01-31 DIAGNOSIS — Z3A23 23 weeks gestation of pregnancy: Secondary | ICD-10-CM

## 2019-01-31 NOTE — Progress Notes (Signed)
   PRENATAL VISIT NOTE  Subjective:  Victoria Glass is a 28 y.o. G2P0010 at [redacted]w[redacted]d being seen today for ongoing prenatal care.  She is currently monitored for the following issues for this low-risk pregnancy and has Obese; Well woman exam; Low grade squamous intraepith lesion on cytologic smear cervix (lgsil); Supervision of other normal pregnancy, antepartum; and Obesity during pregnancy, antepartum on their problem list.  Patient reports no complaints.  Contractions: Not present. Vag. Bleeding: None.  Movement: Present. Denies leaking of fluid.   The following portions of the patient's history were reviewed and updated as appropriate: allergies, current medications, past family history, past medical history, past social history, past surgical history and problem list.   Objective:   Vitals:   01/31/19 0943  BP: 110/73  Pulse: 88  Temp: 98.4 F (36.9 C)  Weight: 192 lb (87.1 kg)    Fetal Status: Fetal Heart Rate (bpm): 152 Fundal Height: 21 cm Movement: Present     General:  Alert, oriented and cooperative. Patient is in no acute distress.  Skin: Skin is warm and dry. No rash noted.   Cardiovascular: Normal heart rate noted  Respiratory: Normal respiratory effort, no problems with respiration noted  Abdomen: Soft, gravid, appropriate for gestational age.  Pain/Pressure: Present     Pelvic: Cervical exam deferred        Extremities: Normal range of motion.  Edema: None  Mental Status: Normal mood and affect. Normal behavior. Normal judgment and thought content.   Assessment and Plan:  Pregnancy: G2P0010 at [redacted]w[redacted]d 1. Supervision of other normal pregnancy, antepartum - patient doing well, no complaints - Patient reports not having a BP cuff at home, babyscripts optimized ordered to allow patient to receive BP cuff - Discussed once patient receives BP cuff, needs to take BP weekly and enter into babyscripts  - COVID 19 precautions  - Anticipatory guidance on upcoming appointments  with GTT at next visit  - Babyscripts Schedule Optimization - AFP, Serum, Open Spina Bifida  2. Obesity during pregnancy, antepartum - TWG 22lbs  - Discussed recommendations of weight gain during pregnancy   Preterm labor symptoms and general obstetric precautions including but not limited to vaginal bleeding, contractions, leaking of fluid and fetal movement were reviewed in detail with the patient. Please refer to After Visit Summary for other counseling recommendations.   Return in about 5 weeks (around 03/07/2019) for ROB/2hGTT.  Future Appointments  Date Time Provider Department Center  03/07/2019  8:15 AM [facility] None  03/07/2019  8:55 AM [place], Wilmer Floor, CNM [facility] None    Sharyon Cable, CNM

## 2019-01-31 NOTE — Patient Instructions (Signed)
Glucose Tolerance Test During Pregnancy Why am I having this test? The glucose tolerance test (GTT) is done to check how your body processes sugar (glucose). This is one of several tests used to diagnose diabetes that develops during pregnancy (gestational diabetes mellitus). Gestational diabetes is a temporary form of diabetes that some women develop during pregnancy. It usually occurs during the second trimester of pregnancy and goes away after delivery. Testing (screening) for gestational diabetes usually occurs between 24 and 28 weeks of pregnancy. You may have the GTT test after having a 1-hour glucose screening test if the results from that test indicate that you may have gestational diabetes. You may also have this test if:  You have a history of gestational diabetes.  You have a history of giving birth to very large babies or have experienced repeated fetal loss (stillbirth).  You have signs and symptoms of diabetes, such as: ? Changes in your vision. ? Tingling or numbness in your hands or feet. ? Changes in hunger, thirst, and urination that are not otherwise explained by your pregnancy. What is being tested? This test measures the amount of glucose in your blood at different times during a period of 3 hours. This indicates how well your body is able to process glucose. What kind of sample is taken?  Blood samples are required for this test. They are usually collected by inserting a needle into a blood vessel. How do I prepare for this test?  For 3 days before your test, eat normally. Have plenty of carbohydrate-rich foods.  Follow instructions from your health care provider about: ? Eating or drinking restrictions on the day of the test. You may be asked to not eat or drink anything other than water (fast) starting 8-10 hours before the test. ? Changing or stopping your regular medicines. Some medicines may interfere with this test. Tell a health care provider about:  All  medicines you are taking, including vitamins, herbs, eye drops, creams, and over-the-counter medicines.  Any blood disorders you have.  Any surgeries you have had.  Any medical conditions you have. What happens during the test? First, your blood glucose will be measured. This is referred to as your fasting blood glucose, since you fasted before the test. Then, you will drink a glucose solution that contains a certain amount of glucose. Your blood glucose will be measured again 1, 2, and 3 hours after drinking the solution. This test takes about 3 hours to complete. You will need to stay at the testing location during this time. During the testing period:  Do not eat or drink anything other than the glucose solution.  Do not exercise.  Do not use any products that contain nicotine or tobacco, such as cigarettes and e-cigarettes. If you need help stopping, ask your health care provider. The testing procedure may vary among health care providers and hospitals. How are the results reported? Your results will be reported as milligrams of glucose per deciliter of blood (mg/dL) or millimoles per liter (mmol/L). Your health care provider will compare your results to normal ranges that were established after testing a large group of people (reference ranges). Reference ranges may vary among labs and hospitals. For this test, common reference ranges are:  Fasting: less than 95-105 mg/dL (5.3-5.8 mmol/L).  1 hour after drinking glucose: less than 180-190 mg/dL (10.0-10.5 mmol/L).  2 hours after drinking glucose: less than 155-165 mg/dL (8.6-9.2 mmol/L).  3 hours after drinking glucose: 140-145 mg/dL (7.8-8.1 mmol/L). What do the   results mean? Results within reference ranges are considered normal, meaning that your glucose levels are well-controlled. If two or more of your blood glucose levels are high, you may be diagnosed with gestational diabetes. If only one level is high, your health care  provider may suggest repeat testing or other tests to confirm a diagnosis. Talk with your health care provider about what your results mean. Questions to ask your health care provider Ask your health care provider, or the department that is doing the test:  When will my results be ready?  How will I get my results?  What are my treatment options?  What other tests do I need?  What are my next steps? Summary  The glucose tolerance test (GTT) is one of several tests used to diagnose diabetes that develops during pregnancy (gestational diabetes mellitus). Gestational diabetes is a temporary form of diabetes that some women develop during pregnancy.  You may have the GTT test after having a 1-hour glucose screening test if the results from that test indicate that you may have gestational diabetes. You may also have this test if you have any symptoms or risk factors for gestational diabetes.  Talk with your health care provider about what your results mean. This information is not intended to replace advice given to you by your health care provider. Make sure you discuss any questions you have with your health care provider. Document Released: 03/28/2012 Document Revised: 05/09/2017 Document Reviewed: 05/09/2017 Elsevier Interactive Patient Education  2019 [employer]  

## 2019-02-02 LAB — AFP, SERUM, OPEN SPINA BIFIDA
AFP MoM: 0.66
AFP Value: 52.1 ng/mL
Gest. Age on Collection Date: 23 weeks
Maternal Age At EDD: 27.6 yr
OSBR Risk 1 IN: 10000
Test Results:: NEGATIVE
Weight: 192 [lb_av]

## 2019-03-07 ENCOUNTER — Other Ambulatory Visit: Payer: BLUE CROSS/BLUE SHIELD

## 2019-03-07 ENCOUNTER — Other Ambulatory Visit: Payer: Self-pay

## 2019-03-07 ENCOUNTER — Ambulatory Visit (INDEPENDENT_AMBULATORY_CARE_PROVIDER_SITE_OTHER): Payer: BLUE CROSS/BLUE SHIELD | Admitting: Advanced Practice Midwife

## 2019-03-07 VITALS — BP 119/74 | HR 102 | Wt 202.4 lb

## 2019-03-07 DIAGNOSIS — Z3403 Encounter for supervision of normal first pregnancy, third trimester: Secondary | ICD-10-CM

## 2019-03-07 DIAGNOSIS — Z3A28 28 weeks gestation of pregnancy: Secondary | ICD-10-CM

## 2019-03-07 NOTE — Progress Notes (Signed)
ROB/GTT.  TDAP declined.  Reports no problems today.

## 2019-03-07 NOTE — Patient Instructions (Signed)
Third Trimester of Pregnancy The third trimester is from week 28 through week 40 (months 7 through 9). The third trimester is a time when the unborn baby (fetus) is growing rapidly. At the end of the ninth month, the fetus is about 20 inches in length and weighs 6-10 pounds. Body changes during your third trimester Your body will continue to go through many changes during pregnancy. The changes vary from woman to woman. During the third trimester:  Your weight will continue to increase. You can expect to gain 25-35 pounds (11-16 kg) by the end of the pregnancy.  You may begin to get stretch marks on your hips, abdomen, and breasts.  You may urinate more often because the fetus is moving lower into your pelvis and pressing on your bladder.  You may develop or continue to have heartburn. This is caused by increased hormones that slow down muscles in the digestive tract.  You may develop or continue to have constipation because increased hormones slow digestion and cause the muscles that push waste through your intestines to relax.  You may develop hemorrhoids. These are swollen veins (varicose veins) in the rectum that can itch or be painful.  You may develop swollen, bulging veins (varicose veins) in your legs.  You may have increased body aches in the pelvis, back, or thighs. This is due to weight gain and increased hormones that are relaxing your joints.  You may have changes in your hair. These can include thickening of your hair, rapid growth, and changes in texture. Some women also have hair loss during or after pregnancy, or hair that feels dry or thin. Your hair will most likely return to normal after your baby is born.  Your breasts will continue to grow and they will continue to become tender. A yellow fluid (colostrum) may leak from your breasts. This is the first milk you are producing for your baby.  Your belly button may stick out.  You may notice more swelling in your hands,  face, or ankles.  You may have increased tingling or numbness in your hands, arms, and legs. The skin on your belly may also feel numb.  You may feel short of breath because of your expanding uterus.  You may have more problems sleeping. This can be caused by the size of your belly, increased need to urinate, and an increase in your body's metabolism.  You may notice the fetus "dropping," or moving lower in your abdomen (lightening).  You may have increased vaginal discharge.  You may notice your joints feel loose and you may have pain around your pelvic bone. What to expect at prenatal visits You will have prenatal exams every 2 weeks until week 36. Then you will have weekly prenatal exams. During a routine prenatal visit:  You will be weighed to make sure you and the baby are growing normally.  Your blood pressure will be taken.  Your abdomen will be measured to track your baby's growth.  The fetal heartbeat will be listened to.  Any test results from the previous visit will be discussed.  You may have a cervical check near your due date to see if your cervix has softened or thinned (effaced).  You will be tested for Group B streptococcus. This happens between 35 and 37 weeks. Your health care provider may ask you:  What your birth plan is.  How you are feeling.  If you are feeling the baby move.  If you have had any abnormal   symptoms, such as leaking fluid, bleeding, severe headaches, or abdominal cramping.  If you are using any tobacco products, including cigarettes, chewing tobacco, and electronic cigarettes.  If you have any questions. Other tests or screenings that may be performed during your third trimester include:  Blood tests that check for low iron levels (anemia).  Fetal testing to check the health, activity level, and growth of the fetus. Testing is done if you have certain medical conditions or if there are problems during the pregnancy.  Nonstress test  (NST). This test checks the health of your baby to make sure there are no signs of problems, such as the baby not getting enough oxygen. During this test, a belt is placed around your belly. The baby is made to move, and its heart rate is monitored during movement. What is false labor? False labor is a condition in which you feel small, irregular tightenings of the muscles in the womb (contractions) that usually go away with rest, changing position, or drinking water. These are called Braxton Hicks contractions. Contractions may last for hours, days, or even weeks before true labor sets in. If contractions come at regular intervals, become more frequent, increase in intensity, or become painful, you should see your health care provider. What are the signs of labor?  Abdominal cramps.  Regular contractions that start at 10 minutes apart and become stronger and more frequent with time.  Contractions that start on the top of the uterus and spread down to the lower abdomen and back.  Increased pelvic pressure and dull back pain.  A watery or bloody mucus discharge that comes from the vagina.  Leaking of amniotic fluid. This is also known as your "water breaking." It could be a slow trickle or a gush. Let your health care provider know if it has a color or strange odor. If you have any of these signs, call your health care provider right away, even if it is before your due date. Follow these instructions at home: Medicines  Follow your health care provider's instructions regarding medicine use. Specific medicines may be either safe or unsafe to take during pregnancy.  Take a prenatal vitamin that contains at least 600 micrograms (mcg) of folic acid.  If you develop constipation, try taking a stool softener if your health care provider approves. Eating and drinking   Eat a balanced diet that includes fresh fruits and vegetables, whole grains, good sources of protein such as meat, eggs, or tofu,  and low-fat dairy. Your health care provider will help you determine the amount of weight gain that is right for you.  Avoid raw meat and uncooked cheese. These carry germs that can cause birth defects in the baby.  If you have low calcium intake from food, talk to your health care provider about whether you should take a daily calcium supplement.  Eat four or five small meals rather than three large meals a day.  Limit foods that are high in fat and processed sugars, such as fried and sweet foods.  To prevent constipation: ? Drink enough fluid to keep your urine clear or pale yellow. ? Eat foods that are high in fiber, such as fresh fruits and vegetables, whole grains, and beans. Activity  Exercise only as directed by your health care provider. Most women can continue their usual exercise routine during pregnancy. Try to exercise for 30 minutes at least 5 days a week. Stop exercising if you experience uterine contractions.  Avoid heavy lifting.  Do   not exercise in extreme heat or humidity, or at high altitudes.  Wear low-heel, comfortable shoes.  Practice good posture.  You may continue to have sex unless your health care provider tells you otherwise. Relieving pain and discomfort  Take frequent breaks and rest with your legs elevated if you have leg cramps or low back pain.  Take warm sitz baths to soothe any pain or discomfort caused by hemorrhoids. Use hemorrhoid cream if your health care provider approves.  Wear a good support bra to prevent discomfort from breast tenderness.  If you develop varicose veins: ? Wear support pantyhose or compression stockings as told by your healthcare provider. ? Elevate your feet for 15 minutes, 3-4 times a day. Prenatal care  Write down your questions. Take them to your prenatal visits.  Keep all your prenatal visits as told by your health care provider. This is important. Safety  Wear your seat belt at all times when driving.  Make  a list of emergency phone numbers, including numbers for family, friends, the hospital, and police and fire departments. General instructions  Avoid cat litter boxes and soil used by cats. These carry germs that can cause birth defects in the baby. If you have a cat, ask someone to clean the litter box for you.  Do not travel far distances unless it is absolutely necessary and only with the approval of your health care provider.  Do not use hot tubs, steam rooms, or saunas.  Do not drink alcohol.  Do not use any products that contain nicotine or tobacco, such as cigarettes and e-cigarettes. If you need help quitting, ask your health care provider.  Do not use any medicinal herbs or unprescribed drugs. These chemicals affect the formation and growth of the baby.  Do not douche or use tampons or scented sanitary pads.  Do not cross your legs for long periods of time.  To prepare for the arrival of your baby: ? Take prenatal classes to understand, practice, and ask questions about labor and delivery. ? Make a trial run to the hospital. ? Visit the hospital and tour the maternity area. ? Arrange for maternity or paternity leave through employers. ? Arrange for family and friends to take care of pets while you are in the hospital. ? Purchase a rear-facing car seat and make sure you know how to install it in your car. ? Pack your hospital bag. ? Prepare the baby's nursery. Make sure to remove all pillows and stuffed animals from the baby's crib to prevent suffocation.  Visit your dentist if you have not gone during your pregnancy. Use a soft toothbrush to brush your teeth and be gentle when you floss. Contact a health care provider if:  You are unsure if you are in labor or if your water has broken.  You become dizzy.  You have mild pelvic cramps, pelvic pressure, or nagging pain in your abdominal area.  You have lower back pain.  You have persistent nausea, vomiting, or  diarrhea.  You have an unusual or bad smelling vaginal discharge.  You have pain when you urinate. Get help right away if:  Your water breaks before 37 weeks.  You have regular contractions less than 5 minutes apart before 37 weeks.  You have a fever.  You are leaking fluid from your vagina.  You have spotting or bleeding from your vagina.  You have severe abdominal pain or cramping.  You have rapid weight loss or weight gain.  You have   shortness of breath with chest pain.  You notice sudden or extreme swelling of your face, hands, ankles, feet, or legs.  Your baby makes fewer than 10 movements in 2 hours.  You have severe headaches that do not go away when you take medicine.  You have vision changes. Summary  The third trimester is from week 28 through week 40, months 7 through 9. The third trimester is a time when the unborn baby (fetus) is growing rapidly.  During the third trimester, your discomfort may increase as you and your baby continue to gain weight. You may have abdominal, leg, and back pain, sleeping problems, and an increased need to urinate.  During the third trimester your breasts will keep growing and they will continue to become tender. A yellow fluid (colostrum) may leak from your breasts. This is the first milk you are producing for your baby.  False labor is a condition in which you feel small, irregular tightenings of the muscles in the womb (contractions) that eventually go away. These are called Braxton Hicks contractions. Contractions may last for hours, days, or even weeks before true labor sets in.  Signs of labor can include: abdominal cramps; regular contractions that start at 10 minutes apart and become stronger and more frequent with time; watery or bloody mucus discharge that comes from the vagina; increased pelvic pressure and dull back pain; and leaking of amniotic fluid. This information is not intended to replace advice given to you by your  health care provider. Make sure you discuss any questions you have with your health care provider. Document Released: 09/21/2001 Document Revised: 11/02/2016 Document Reviewed: 11/02/2016 Elsevier Interactive Patient Education  2019 [employer]  

## 2019-03-07 NOTE — Progress Notes (Signed)
   PRENATAL VISIT NOTE  Subjective:  Victoria Glass is a 28 y.o. G2P0010 at [redacted]w[redacted]d being seen today for ongoing prenatal care.  She is currently monitored for the following issues for this low-risk pregnancy and has Obese; Well woman exam; Low grade squamous intraepith lesion on cytologic smear cervix (lgsil); Supervision of other normal pregnancy, antepartum; and Obesity during pregnancy, antepartum on their problem list.  Patient reports no complaints.  Contractions: Not present. Vag. Bleeding: None.  Movement: Present. Denies leaking of fluid.   The following portions of the patient's history were reviewed and updated as appropriate: allergies, current medications, past family history, past medical history, past social history, past surgical history and problem list.   Objective:   Vitals:   03/07/19 0832  BP: 119/74  Pulse: (!) 102  Weight: 202 lb 6.4 oz (91.8 kg)    Fetal Status: Fetal Heart Rate (bpm): 159   Movement: Present     General:  Alert, oriented and cooperative. Patient is in no acute distress.  Skin: Skin is warm and dry. No rash noted.   Cardiovascular: Normal heart rate noted  Respiratory: Normal respiratory effort, no problems with respiration noted  Abdomen: Soft, gravid, appropriate for gestational age.  Pain/Pressure: Absent     Pelvic: Cervical exam deferred        Extremities: Normal range of motion.  Edema: None  Mental Status: Normal mood and affect. Normal behavior. Normal judgment and thought content.   Assessment and Plan:  Pregnancy: G2P0010 at [redacted]w[redacted]d 1. Encounter for supervision of normal first pregnancy in second trimester --Anticipatory guidance about next visits/weeks of pregnancy given. --Reviewed safety, visitor policy, reassurance about COVID-19 for pregnancy at this time. Discussed possible changes to visits, including televisits, that may occur due to COVID-19.  The office remains open if pt needs to be seen and MAU is open 24 hours/day for OB  emergencies.   - Glucose Tolerance, 2 Hours w/1 Hour - CBC - HIV antibody (with reflex) - RPR  Preterm labor symptoms and general obstetric precautions including but not limited to vaginal bleeding, contractions, leaking of fluid and fetal movement were reviewed in detail with the patient. Please refer to After Visit Summary for other counseling recommendations.   No follow-ups on file.  No future appointments.  Sharen Counter, CNM

## 2019-03-08 LAB — CBC
Hematocrit: 30.8 % — ABNORMAL LOW (ref 34.0–46.6)
Hemoglobin: 10.6 g/dL — ABNORMAL LOW (ref 11.1–15.9)
MCH: 31.3 pg (ref 26.6–33.0)
MCHC: 34.4 g/dL (ref 31.5–35.7)
MCV: 91 fL (ref 79–97)
Platelets: 252 10*3/uL (ref 150–450)
RBC: 3.39 x10E6/uL — ABNORMAL LOW (ref 3.77–5.28)
RDW: 12.8 % (ref 11.7–15.4)
WBC: 8.4 10*3/uL (ref 3.4–10.8)

## 2019-03-08 LAB — GLUCOSE TOLERANCE, 2 HOURS W/ 1HR
Glucose, 1 hour: 147 mg/dL (ref 65–179)
Glucose, 2 hour: 105 mg/dL (ref 65–152)
Glucose, Fasting: 86 mg/dL (ref 65–91)

## 2019-03-08 LAB — HIV ANTIBODY (ROUTINE TESTING W REFLEX): HIV Screen 4th Generation wRfx: NONREACTIVE

## 2019-03-08 LAB — RPR: RPR Ser Ql: NONREACTIVE

## 2019-04-04 ENCOUNTER — Telehealth (INDEPENDENT_AMBULATORY_CARE_PROVIDER_SITE_OTHER): Payer: BC Managed Care – PPO | Admitting: Obstetrics & Gynecology

## 2019-04-04 ENCOUNTER — Encounter: Payer: Self-pay | Admitting: *Deleted

## 2019-04-04 VITALS — BP 111/64 | HR 95

## 2019-04-04 DIAGNOSIS — Z3A32 32 weeks gestation of pregnancy: Secondary | ICD-10-CM

## 2019-04-04 DIAGNOSIS — O99210 Obesity complicating pregnancy, unspecified trimester: Secondary | ICD-10-CM

## 2019-04-04 DIAGNOSIS — O99213 Obesity complicating pregnancy, third trimester: Secondary | ICD-10-CM

## 2019-04-04 DIAGNOSIS — Z3480 Encounter for supervision of other normal pregnancy, unspecified trimester: Secondary | ICD-10-CM

## 2019-04-04 NOTE — Progress Notes (Signed)
Pt would like to discuss birth plan.

## 2019-04-04 NOTE — Progress Notes (Signed)
   [place] VIRTUAL VIDEO VISIT ENCOUNTER NOTE  Provider location: [facility] at [place]   I connected with [name] on 04/04/19 at  8:15 AM EDT by MyChart Video Encounter at home and verified that I am speaking with the correct person using two identifiers.   I discussed the limitations, risks, security and privacy concerns of performing an evaluation and management service by telephone and the availability of in person appointments. I also discussed with the patient that there may be a patient responsible charge related to this service. The patient expressed understanding and agreed to proceed. Subjective:  Victoria Glass is a 28 y.o. G2P0010 at [redacted]w[redacted]d being seen today for ongoing prenatal care.  She is currently monitored for the following issues for this high-risk pregnancy and has Obese; Low grade squamous intraepith lesion on cytologic smear cervix (lgsil); Supervision of other normal pregnancy, antepartum; and Obesity during pregnancy, antepartum on their problem list.  Patient reports no complaints.  Contractions: Not present. Vag. Bleeding: None.  Movement: Present. Denies any leaking of fluid.   The following portions of the patient's history were reviewed and updated as appropriate: allergies, current medications, past family history, past medical history, past social history, past surgical history and problem list.   Objective:   Vitals:   04/04/19 0820  BP: 111/64  Pulse: 95    Fetal Status:     Movement: Present     General:  Alert, oriented and cooperative. Patient is in no acute distress.  Respiratory: Normal respiratory effort, no problems with respiration noted  Mental Status: Normal mood and affect. Normal behavior. Normal judgment and thought content.  Rest of physical exam deferred due to type of encounter  Imaging: No results found.  Assessment and Plan:  Pregnancy: G2P0010 at [redacted]w[redacted]d 1. Supervision of other normal  pregnancy, antepartum - She has a BP cuff and scale at home  2. Obesity during pregnancy, antepartum   Preterm labor symptoms and general obstetric precautions including but not limited to vaginal bleeding, contractions, leaking of fluid and fetal movement were reviewed in detail with the patient. I discussed the assessment and treatment plan with the patient. The patient was provided an opportunity to ask questions and all were answered. The patient agreed with the plan and demonstrated an understanding of the instructions. The patient was advised to call back or seek an in-person office evaluation/go to MAU at [facility] for any urgent or concerning symptoms. Please refer to After Visit Summary for other counseling recommendations.   I provided 10 minutes of face-to-face time during this encounter.  No follow-ups on file.  No future appointments.  Emily Filbert, MD [facility], [place]

## 2019-04-18 ENCOUNTER — Telehealth (INDEPENDENT_AMBULATORY_CARE_PROVIDER_SITE_OTHER): Payer: BC Managed Care – PPO | Admitting: Obstetrics & Gynecology

## 2019-04-18 DIAGNOSIS — O99210 Obesity complicating pregnancy, unspecified trimester: Secondary | ICD-10-CM

## 2019-04-18 DIAGNOSIS — O99213 Obesity complicating pregnancy, third trimester: Secondary | ICD-10-CM

## 2019-04-18 DIAGNOSIS — Z3A34 34 weeks gestation of pregnancy: Secondary | ICD-10-CM

## 2019-04-18 DIAGNOSIS — Z3480 Encounter for supervision of other normal pregnancy, unspecified trimester: Secondary | ICD-10-CM

## 2019-04-18 NOTE — Progress Notes (Signed)
    [facility] VIRTUAL OBSTETRICS VISIT ENCOUNTER NOTE  I connected with Victoria Glass on 04/18/19 at  8:45 AM EDT by telephone at home and verified that I am speaking with the correct person using two identifiers.   I discussed the limitations, risks, security and privacy concerns of performing an evaluation and management service by telephone and the availability of in person appointments. I also discussed with the patient that there may be a patient responsible charge related to this service. The patient expressed understanding and agreed to proceed.  Subjective:  Victoria Glass is a 29 y.o. G2P0010 at [redacted]w[redacted]d being followed for ongoing prenatal care.  She is currently monitored for the following issues for this low-risk pregnancy and has Obese; Low grade squamous intraepith lesion on cytologic smear cervix (lgsil); Supervision of other normal pregnancy, antepartum; and Obesity during pregnancy, antepartum on their problem list.  Patient reports backache and occasional contractions. Reports fetal movement. Denies any contractions, bleeding or leaking of fluid.   The following portions of the patient's history were reviewed and updated as appropriate: allergies, current medications, past family history, past medical history, past social history, past surgical history and problem list.   Objective:   General:  Alert, oriented and cooperative.   Mental Status: Normal mood and affect perceived. Normal judgment and thought content.  Rest of physical exam deferred due to type of encounter  Assessment and Plan:  Pregnancy: G2P0010 at [redacted]w[redacted]d 1. Supervision of other normal pregnancy, antepartum d  Preterm labor symptoms and general obstetric precautions including but not limited to vaginal bleeding, contractions, leaking of fluid and fetal movement were reviewed in detail with the patient.  I discussed the assessment and treatment plan with the patient. The patient was provided an opportunity to  ask questions and all were answered. The patient agreed with the plan and demonstrated an understanding of the instructions. The patient was advised to call back or seek an in-person office evaluation/go to MAU at [facility] for any urgent or concerning symptoms. Please refer to After Visit Summary for other counseling recommendations.   I provided 10 minutes of non-face-to-face time during this encounter.  Return in about 2 weeks (around 05/02/2019) for GBS in person.  No future appointments.  Emeterio Reeve, MD [facility], [place]

## 2019-04-18 NOTE — Progress Notes (Signed)
I connected with Victoria Glass on 04/18/19 at  8:45 AM EDT by telephone and verified that I am speaking with the correct person using two identifiers.  Pt c/o low back pain - she has ordered maternity belt already.

## 2019-04-18 NOTE — Patient Instructions (Signed)

## 2019-05-02 ENCOUNTER — Ambulatory Visit (INDEPENDENT_AMBULATORY_CARE_PROVIDER_SITE_OTHER): Payer: BC Managed Care – PPO | Admitting: Obstetrics and Gynecology

## 2019-05-02 ENCOUNTER — Other Ambulatory Visit: Payer: Self-pay

## 2019-05-02 ENCOUNTER — Encounter: Payer: Self-pay | Admitting: Obstetrics and Gynecology

## 2019-05-02 VITALS — BP 123/83 | HR 102 | Wt 210.0 lb

## 2019-05-02 DIAGNOSIS — O99210 Obesity complicating pregnancy, unspecified trimester: Secondary | ICD-10-CM

## 2019-05-02 DIAGNOSIS — Z3A36 36 weeks gestation of pregnancy: Secondary | ICD-10-CM

## 2019-05-02 DIAGNOSIS — Z3480 Encounter for supervision of other normal pregnancy, unspecified trimester: Secondary | ICD-10-CM

## 2019-05-02 DIAGNOSIS — O99213 Obesity complicating pregnancy, third trimester: Secondary | ICD-10-CM

## 2019-05-02 DIAGNOSIS — Z113 Encounter for screening for infections with a predominantly sexual mode of transmission: Secondary | ICD-10-CM | POA: Diagnosis not present

## 2019-05-02 NOTE — Progress Notes (Addendum)
   PRENATAL VISIT NOTE  Subjective:  Victoria Glass is a 28 y.o. G2P0010 at [redacted]w[redacted]d being seen today for ongoing prenatal care.  She is currently monitored for the following issues for this low-risk pregnancy and has Obese; Low grade squamous intraepith lesion on cytologic smear cervix (lgsil); Supervision of other normal pregnancy, antepartum; and Obesity during pregnancy, antepartum on their problem list.  Patient reports no complaints.  Contractions: Irregular. Vag. Bleeding: None.  Movement: Present. Denies leaking of fluid.   The following portions of the patient's history were reviewed and updated as appropriate: allergies, current medications, past family history, past medical history, past social history, past surgical history and problem list.   Objective:   Vitals:   05/02/19 1331  BP: 123/83  Pulse: (!) 102  Weight: 210 lb (95.3 kg)    Fetal Status: Fetal Heart Rate (bpm): 145 Fundal Height: 36 cm Movement: Present  Presentation: Vertex  General:  Alert, oriented and cooperative. Patient is in no acute distress.  Skin: Skin is warm and dry. No rash noted.   Cardiovascular: Normal heart rate noted  Respiratory: Normal respiratory effort, no problems with respiration noted  Abdomen: Soft, gravid, appropriate for gestational age.  Pain/Pressure: Present     Pelvic: Cervical exam performed Dilation: Closed Effacement (%): Thick Station: Ballotable  Extremities: Normal range of motion.     Mental Status: Normal mood and affect. Normal behavior. Normal judgment and thought content.   Assessment and Plan:  Pregnancy: G2P0010 at [redacted]w[redacted]d 1. Supervision of other normal pregnancy, antepartum Patient is doing well Cultures collected Patient undecided on contraception (considering contraceptive pill) and still researching pediatrician  2. Obesity during pregnancy, antepartum Discussed excessive weight gain  Patient instructed to discontinue ASA on 7/29  Preterm labor symptoms and  general obstetric precautions including but not limited to vaginal bleeding, contractions, leaking of fluid and fetal movement were reviewed in detail with the patient. Please refer to After Visit Summary for other counseling recommendations.   Return in about 2 weeks (around 05/16/2019) for [facility], [place].  No future appointments.  Mora Bellman, MD

## 2019-05-03 LAB — GC/CHLAMYDIA PROBE AMP (~~LOC~~) NOT AT ARMC
Chlamydia: NEGATIVE
Neisseria Gonorrhea: NEGATIVE

## 2019-05-06 LAB — CULTURE, BETA STREP (GROUP B ONLY): Strep Gp B Culture: NEGATIVE

## 2019-05-16 ENCOUNTER — Encounter: Payer: Self-pay | Admitting: Obstetrics

## 2019-05-16 ENCOUNTER — Telehealth (INDEPENDENT_AMBULATORY_CARE_PROVIDER_SITE_OTHER): Payer: BC Managed Care – PPO | Admitting: Obstetrics

## 2019-05-16 VITALS — BP 111/73 | HR 86

## 2019-05-16 DIAGNOSIS — Z3A38 38 weeks gestation of pregnancy: Secondary | ICD-10-CM

## 2019-05-16 DIAGNOSIS — O99210 Obesity complicating pregnancy, unspecified trimester: Secondary | ICD-10-CM

## 2019-05-16 DIAGNOSIS — R87612 Low grade squamous intraepithelial lesion on cytologic smear of cervix (LGSIL): Secondary | ICD-10-CM

## 2019-05-16 DIAGNOSIS — Z3480 Encounter for supervision of other normal pregnancy, unspecified trimester: Secondary | ICD-10-CM

## 2019-05-16 DIAGNOSIS — O99213 Obesity complicating pregnancy, third trimester: Secondary | ICD-10-CM

## 2019-05-16 NOTE — Progress Notes (Signed)
Pt presents for a my chart visit. Pt identified with 2 pt identifiers. She is 38w today. BP is 111/73. Pt has no concerns today.

## 2019-05-16 NOTE — Progress Notes (Signed)
   [place] VIRTUAL VIDEO VISIT ENCOUNTER NOTE  Provider location: [facility] [place]   I connected with Victoria Glass on 05/16/19 at  1:45 PM EDT by WebEx OB MyChart Video Encounter at home and verified that I am speaking with the correct person using two identifiers.   I discussed the limitations, risks, security and privacy concerns of performing an evaluation and management service virtually and the availability of in person appointments. I also discussed with the patient that there may be a patient responsible charge related to this service. The patient expressed understanding and agreed to proceed. Subjective:  Victoria Glass is a 28 y.o. G2P0010 at [redacted]w[redacted]d being seen today for ongoing prenatal care.  She is currently monitored for the following issues for this low-risk pregnancy and has Obese; Low grade squamous intraepith lesion on cytologic smear cervix (lgsil); Supervision of other normal pregnancy, antepartum; and Obesity during pregnancy, antepartum on their problem list.  Patient reports backache.  Contractions: Irritability. Vag. Bleeding: None.  Movement: Present. Denies any leaking of fluid.   The following portions of the patient's history were reviewed and updated as appropriate: allergies, current medications, past family history, past medical history, past social history, past surgical history and problem list.   Objective:   Vitals:   05/16/19 1338  BP: 111/73  Pulse: 86    Fetal Status:     Movement: Present     General:  Alert, oriented and cooperative. Patient is in no acute distress.  Respiratory: Normal respiratory effort, no problems with respiration noted  Mental Status: Normal mood and affect. Normal behavior. Normal judgment and thought content.  Rest of physical exam deferred due to type of encounter  Imaging: No results found.  Assessment and Plan:  Pregnancy: G2P0010 at [redacted]w[redacted]d 1. Supervision of other normal  pregnancy, antepartum  2. Obesity during pregnancy, antepartum  3. Low grade squamous intraepith lesion on cytologic smear cervix (lgsil) - repeat pap / colpo 3-4 months postpartum   Term labor symptoms and general obstetric precautions including but not limited to vaginal bleeding, contractions, leaking of fluid and fetal movement were reviewed in detail with the patient. I discussed the assessment and treatment plan with the patient. The patient was provided an opportunity to ask questions and all were answered. The patient agreed with the plan and demonstrated an understanding of the instructions. The patient was advised to call back or seek an in-person office evaluation/go to [facility]U at [facility] for any urgent or concerning symptoms. Please refer to After Visit Summary for other counseling recommendations.   I provided 10 minutes of face-to-face time during this encounter.  Return in about 1 week (around 05/23/2019) for ROB in person.    Baltazar Najjar, MD [facility], [facility] 05-16-2019

## 2019-05-22 ENCOUNTER — Encounter: Payer: Self-pay | Admitting: Obstetrics and Gynecology

## 2019-05-22 ENCOUNTER — Ambulatory Visit (INDEPENDENT_AMBULATORY_CARE_PROVIDER_SITE_OTHER): Payer: BC Managed Care – PPO | Admitting: Obstetrics and Gynecology

## 2019-05-22 ENCOUNTER — Other Ambulatory Visit: Payer: Self-pay

## 2019-05-22 VITALS — BP 124/79 | HR 120 | Wt 214.0 lb

## 2019-05-22 DIAGNOSIS — Z3480 Encounter for supervision of other normal pregnancy, unspecified trimester: Secondary | ICD-10-CM

## 2019-05-22 DIAGNOSIS — O99213 Obesity complicating pregnancy, third trimester: Secondary | ICD-10-CM

## 2019-05-22 DIAGNOSIS — O99210 Obesity complicating pregnancy, unspecified trimester: Secondary | ICD-10-CM

## 2019-05-22 DIAGNOSIS — Z3A38 38 weeks gestation of pregnancy: Secondary | ICD-10-CM

## 2019-05-22 NOTE — Progress Notes (Signed)
   PRENATAL VISIT NOTE  Subjective:  Victoria Glass is a 28 y.o. G2P0010 at [redacted]w[redacted]d being seen today for ongoing prenatal care.  She is currently monitored for the following issues for this low-risk pregnancy and has Obese; Low grade squamous intraepith lesion on cytologic smear cervix (lgsil); Supervision of other normal pregnancy, antepartum; and Obesity during pregnancy, antepartum on their problem list.  Patient reports no complaints.  Contractions: Irregular. Vag. Bleeding: None.  Movement: Present. Denies leaking of fluid.   The following portions of the patient's history were reviewed and updated as appropriate: allergies, current medications, past family history, past medical history, past social history, past surgical history and problem list.   Objective:   Vitals:   05/22/19 1433  BP: 124/79  Pulse: (!) 120  Weight: 214 lb (97.1 kg)    Fetal Status: Fetal Heart Rate (bpm): 145 Fundal Height: 39 cm Movement: Present  Presentation: Vertex  General:  Alert, oriented and cooperative. Patient is in no acute distress.  Skin: Skin is warm and dry. No rash noted.   Cardiovascular: Normal heart rate noted  Respiratory: Normal respiratory effort, no problems with respiration noted  Abdomen: Soft, gravid, appropriate for gestational age.  Pain/Pressure: Present     Pelvic: Cervical exam performed Dilation: Closed Effacement (%): Thick Station: Ballotable  Extremities: Normal range of motion.     Mental Status: Normal mood and affect. Normal behavior. Normal judgment and thought content.   Assessment and Plan:  Pregnancy: G2P0010 at [redacted]w[redacted]d 1. Supervision of other normal pregnancy, antepartum Patient is doing well without complaints IOL scheduled at 41 weeks  2. Obesity during pregnancy, antepartum   Term labor symptoms and general obstetric precautions including but not limited to vaginal bleeding, contractions, leaking of fluid and fetal movement were reviewed in detail with the  patient. Please refer to After Visit Summary for other counseling recommendations.   Return in about 1 week (around 05/29/2019) for IN PERSON, ROB, Low risk.  No future appointments.  Mora Bellman, MD

## 2019-05-24 ENCOUNTER — Telehealth (HOSPITAL_COMMUNITY): Payer: Self-pay | Admitting: *Deleted

## 2019-05-24 NOTE — Telephone Encounter (Signed)
Preadmission screen  

## 2019-05-25 ENCOUNTER — Telehealth (HOSPITAL_COMMUNITY): Payer: Self-pay | Admitting: *Deleted

## 2019-05-25 NOTE — Telephone Encounter (Signed)
Preadmission screen  

## 2019-05-29 ENCOUNTER — Encounter (HOSPITAL_COMMUNITY): Payer: Self-pay | Admitting: *Deleted

## 2019-05-29 ENCOUNTER — Telehealth (HOSPITAL_COMMUNITY): Payer: Self-pay | Admitting: *Deleted

## 2019-05-29 ENCOUNTER — Other Ambulatory Visit: Payer: Self-pay | Admitting: Obstetrics and Gynecology

## 2019-05-29 NOTE — Telephone Encounter (Signed)
Preadmission screen  

## 2019-06-01 ENCOUNTER — Inpatient Hospital Stay (HOSPITAL_COMMUNITY)
Admission: AD | Admit: 2019-06-01 | Discharge: 2019-06-01 | Disposition: A | Discharge status: Home / Self Care | Payer: BC Managed Care – PPO | Source: Home / Self Care | Attending: Obstetrics & Gynecology | Admitting: Obstetrics & Gynecology

## 2019-06-01 ENCOUNTER — Encounter (HOSPITAL_COMMUNITY): Payer: Self-pay | Admitting: *Deleted

## 2019-06-01 ENCOUNTER — Ambulatory Visit (INDEPENDENT_AMBULATORY_CARE_PROVIDER_SITE_OTHER): Payer: BC Managed Care – PPO | Admitting: Family Medicine

## 2019-06-01 ENCOUNTER — Other Ambulatory Visit: Payer: Self-pay | Admitting: Advanced Practice Midwife

## 2019-06-01 ENCOUNTER — Other Ambulatory Visit: Payer: Self-pay

## 2019-06-01 VITALS — BP 122/80 | HR 92 | Wt 214.1 lb

## 2019-06-01 DIAGNOSIS — O479 False labor, unspecified: Secondary | ICD-10-CM

## 2019-06-01 DIAGNOSIS — Z3480 Encounter for supervision of other normal pregnancy, unspecified trimester: Secondary | ICD-10-CM

## 2019-06-01 DIAGNOSIS — Z3A40 40 weeks gestation of pregnancy: Secondary | ICD-10-CM

## 2019-06-01 DIAGNOSIS — O471 False labor at or after 37 completed weeks of gestation: Secondary | ICD-10-CM | POA: Insufficient documentation

## 2019-06-01 DIAGNOSIS — O480 Post-term pregnancy: Secondary | ICD-10-CM | POA: Diagnosis not present

## 2019-06-01 HISTORY — DX: Other specified health status: Z78.9

## 2019-06-01 NOTE — Discharge Instructions (Signed)
Braxton Hicks Contractions °Contractions of the uterus can occur throughout pregnancy, but they are not always a sign that you are in labor. You may have practice contractions called Braxton Hicks contractions. These false labor contractions are sometimes confused with true labor. °What are Braxton Hicks contractions? °Braxton Hicks contractions are tightening movements that occur in the muscles of the uterus before labor. Unlike true labor contractions, these contractions do not result in opening (dilation) and thinning of the cervix. Toward the end of pregnancy (32-34 weeks), Braxton Hicks contractions can happen more often and may become stronger. These contractions are sometimes difficult to tell apart from true labor because they can be very uncomfortable. You should not feel embarrassed if you go to the hospital with false labor. °Sometimes, the only way to tell if you are in true labor is for your health care provider to look for changes in the cervix. The health care provider will do a physical exam and may monitor your contractions. If you are not in true labor, the exam should show that your cervix is not dilating and your water has not broken. °If there are no other health problems associated with your pregnancy, it is completely safe for you to be sent home with false labor. You may continue to have Braxton Hicks contractions until you go into true labor. °How to tell the difference between true labor and false labor °True labor °· Contractions last 30-70 seconds. °· Contractions become very regular. °· Discomfort is usually felt in the top of the uterus, and it spreads to the lower abdomen and low back. °· Contractions do not go away with walking. °· Contractions usually become more intense and increase in frequency. °· The cervix dilates and gets thinner. °False labor °· Contractions are usually shorter and not as strong as true labor contractions. °· Contractions are usually irregular. °· Contractions  are often felt in the front of the lower abdomen and in the groin. °· Contractions may go away when you walk around or change positions while lying down. °· Contractions get weaker and are shorter-lasting as time goes on. °· The cervix usually does not dilate or become thin. °Follow these instructions at home: ° °· Take over-the-counter and prescription medicines only as told by your health care provider. °· Keep up with your usual exercises and follow other instructions from your health care provider. °· Eat and drink lightly if you think you are going into labor. °· If Braxton Hicks contractions are making you uncomfortable: °? Change your position from lying down or resting to walking, or change from walking to resting. °? Sit and rest in a tub of warm water. °? Drink enough fluid to keep your urine pale yellow. Dehydration may cause these contractions. °? Do slow and deep breathing several times an hour. °· Keep all follow-up prenatal visits as told by your health care provider. This is important. °Contact a health care provider if: °· You have a fever. °· You have continuous pain in your abdomen. °Get help right away if: °· Your contractions become stronger, more regular, and closer together. °· You have fluid leaking or gushing from your vagina. °· You pass blood-tinged mucus (bloody show). °· You have bleeding from your vagina. °· You have low back pain that you never had before. °· You feel your baby’s head pushing down and causing pelvic pressure. °· Your baby is not moving inside you as much as it used to. °Summary °· Contractions that occur before labor are   called Braxton Hicks contractions, false labor, or practice contractions. °· Braxton Hicks contractions are usually shorter, weaker, farther apart, and less regular than true labor contractions. True labor contractions usually become progressively stronger and regular, and they become more frequent. °· Manage discomfort from Braxton Hicks contractions  by changing position, resting in a warm bath, drinking plenty of water, or practicing deep breathing. °This information is not intended to replace advice given to you by your health care provider. Make sure you discuss any questions you have with your health care provider. °Document Released: 02/10/2017 Document Revised: 09/09/2017 Document Reviewed: 02/10/2017 °Elsevier Patient Education © 2020 [employer] °Call your OB Clinic or go to [facility] if: °· You begin to have strong, frequent contractions °· Your water breaks.  Sometimes it is a big gush of fluid, sometimes it is just a trickle that keeps getting your panties wet or running down your legs °· You have vaginal bleeding.  It is normal to have a small amount of spotting if your cervix was checked.  °· You don't feel your baby moving like normal.  If you don't, get you something to eat and drink and lay down and focus on feeling your baby move.  You should feel at least 10 movements in 2 hours.  If you don't, you should call the office or go to [facility].  ° °

## 2019-06-01 NOTE — OB Triage Note (Signed)
I have communicated with faculty resident Barrington Ellison and reviewed vital signs:  Vitals:   06/01/19 1820 06/01/19 1925  BP: 121/72 125/66  Pulse: 100 91  Resp: 17   Temp: 98.5 F (36.9 C)   SpO2: 95%     Vaginal exam:  Dilation: 3 Effacement (%): 70 Station: -2 Presentation: Vertex Exam by:: ansah-mensah, rnc,   Also reviewed contraction pattern and that non-stress test is reactive.  It has been documented that patient is contracting every 1.5-4 minutes with minimal cervical change since 10am this morning not indicating active labor.  Patient denies any other complaints.  Based on this report provider has given order for discharge.  A discharge order and diagnosis entered by a provider.   Labor discharge instructions reviewed with patient.

## 2019-06-01 NOTE — Progress Notes (Signed)
Pt presents for ROB. Pt is postdates. She states that she did notice some bloody show this am, and thinks that she is having contractions. No other concerns

## 2019-06-01 NOTE — Progress Notes (Signed)
Labor Progress Note Victoria Glass is a 28 y.o. G2P0010 at [redacted]w[redacted]d presented for SOL. S: Discussed with RN. Patient was 2 cm in clinic earlier today and now 3 cm. Patient is talking through ctx. No vaginal bleeding or LOF. Normal FM. She would like to go home and come back when she is further along in labor.   O:  BP 121/72 (BP Location: Right Arm)   Pulse 100   Temp 98.5 F (36.9 C) (Oral)   Resp 17   Ht 5\' 3"  (1.6 m)   Wt 97.1 kg   LMP 08/23/2018 (Exact Date)   SpO2 95%   BMI 37.93 kg/m  EFM: 145, reactive, moderate variability, no decels, pos accels   CVE: Dilation: 3 Effacement (%): 70 Station: -2 Presentation: Vertex Exam by:: ansah-mensah, rnc   A&P: 28 y.o. G2P0010 [redacted]w[redacted]d presenting to MAU for latent labor. FHT reviewed. Cat I. Patient desires to labor at home and come back when in active labor. Okay to DC home with good return precautions. GBS neg.    Chauncey Mann, MD 7:21 PM

## 2019-06-01 NOTE — Patient Instructions (Signed)

## 2019-06-01 NOTE — MAU Note (Signed)
Had appt this morning, membranes stripped, was 3 cm.  Had some bloody mucous, and started contracting. Now every 5-6 min.

## 2019-06-02 ENCOUNTER — Other Ambulatory Visit: Payer: Self-pay

## 2019-06-02 ENCOUNTER — Inpatient Hospital Stay (HOSPITAL_COMMUNITY)
Admission: AD | Admit: 2019-06-02 | Discharge: 2019-06-05 | DRG: 788 | Disposition: A | Discharge status: Home / Self Care | Payer: BC Managed Care – PPO | Attending: Obstetrics & Gynecology | Admitting: Obstetrics & Gynecology

## 2019-06-02 ENCOUNTER — Inpatient Hospital Stay (HOSPITAL_COMMUNITY): Payer: BC Managed Care – PPO | Admitting: Anesthesiology

## 2019-06-02 ENCOUNTER — Encounter (HOSPITAL_COMMUNITY): Payer: Self-pay | Admitting: *Deleted

## 2019-06-02 DIAGNOSIS — Z3A40 40 weeks gestation of pregnancy: Secondary | ICD-10-CM

## 2019-06-02 DIAGNOSIS — O99214 Obesity complicating childbirth: Secondary | ICD-10-CM | POA: Diagnosis present

## 2019-06-02 DIAGNOSIS — O9081 Anemia of the puerperium: Secondary | ICD-10-CM | POA: Diagnosis not present

## 2019-06-02 DIAGNOSIS — Z20828 Contact with and (suspected) exposure to other viral communicable diseases: Secondary | ICD-10-CM | POA: Diagnosis present

## 2019-06-02 DIAGNOSIS — Z3490 Encounter for supervision of normal pregnancy, unspecified, unspecified trimester: Secondary | ICD-10-CM

## 2019-06-02 DIAGNOSIS — E669 Obesity, unspecified: Secondary | ICD-10-CM | POA: Diagnosis present

## 2019-06-02 DIAGNOSIS — Z3480 Encounter for supervision of other normal pregnancy, unspecified trimester: Secondary | ICD-10-CM

## 2019-06-02 DIAGNOSIS — O26893 Other specified pregnancy related conditions, third trimester: Secondary | ICD-10-CM | POA: Diagnosis present

## 2019-06-02 DIAGNOSIS — O480 Post-term pregnancy: Secondary | ICD-10-CM | POA: Diagnosis not present

## 2019-06-02 LAB — CBC
HCT: 34.7 % — ABNORMAL LOW (ref 36.0–46.0)
Hemoglobin: 11.2 g/dL — ABNORMAL LOW (ref 12.0–15.0)
MCH: 29.4 pg (ref 26.0–34.0)
MCHC: 32.3 g/dL (ref 30.0–36.0)
MCV: 91.1 fL (ref 80.0–100.0)
Platelets: 324 10*3/uL (ref 150–400)
RBC: 3.81 MIL/uL — ABNORMAL LOW (ref 3.87–5.11)
RDW: 15 % (ref 11.5–15.5)
WBC: 9.6 10*3/uL (ref 4.0–10.5)
nRBC: 0 % (ref 0.0–0.2)

## 2019-06-02 LAB — TYPE AND SCREEN
ABO/RH(D): A POS
Antibody Screen: NEGATIVE

## 2019-06-02 LAB — RPR: RPR Ser Ql: NONREACTIVE

## 2019-06-02 LAB — ABO/RH: ABO/RH(D): A POS

## 2019-06-02 LAB — SARS CORONAVIRUS 2 BY RT PCR (HOSPITAL ORDER, PERFORMED IN ~~LOC~~ HOSPITAL LAB): SARS Coronavirus 2: NEGATIVE

## 2019-06-02 MED ORDER — OXYCODONE-ACETAMINOPHEN 5-325 MG PO TABS: 2.0000 | ORAL_TABLET | ORAL | Status: DC | PRN

## 2019-06-02 MED ORDER — LIDOCAINE HCL (PF) 1 % IJ SOLN: 30.0000 mL | INTRAMUSCULAR | Status: DC | PRN

## 2019-06-02 MED ORDER — EPHEDRINE 5 MG/ML INJ: 10.0000 mg | INTRAVENOUS | Status: DC | PRN

## 2019-06-02 MED ORDER — PHENYLEPHRINE 40 MCG/ML (10ML) SYRINGE FOR IV PUSH (FOR BLOOD PRESSURE SUPPORT)
80.0000 ug | PREFILLED_SYRINGE | INTRAVENOUS | Status: DC | PRN
  Filled 2019-06-02: qty 10

## 2019-06-02 MED ORDER — ACETAMINOPHEN 325 MG PO TABS: 650.0000 mg | ORAL_TABLET | ORAL | Status: DC | PRN

## 2019-06-02 MED ORDER — OXYTOCIN 40 UNITS IN NORMAL SALINE INFUSION - SIMPLE MED: 2.5000 [IU]/h | INTRAVENOUS | Status: DC

## 2019-06-02 MED ORDER — LACTATED RINGERS IV SOLN
INTRAVENOUS | Status: DC
  Administered 2019-06-02 – 2019-06-03 (×5): via INTRAVENOUS

## 2019-06-02 MED ORDER — LIDOCAINE HCL (PF) 1 % IJ SOLN
INTRAMUSCULAR | Status: DC | PRN
  Administered 2019-06-02: 5 mL via EPIDURAL
  Administered 2019-06-02: 7 mL via EPIDURAL

## 2019-06-02 MED ORDER — TERBUTALINE SULFATE 1 MG/ML IJ SOLN: 0.2500 mg | Freq: Once | INTRAMUSCULAR | Status: DC | PRN

## 2019-06-02 MED ORDER — OXYTOCIN 40 UNITS IN NORMAL SALINE INFUSION - SIMPLE MED
1.0000 m[IU]/min | INTRAVENOUS | Status: DC
  Administered 2019-06-02: 12:00:00 2 m[IU]/min via INTRAVENOUS
  Administered 2019-06-03: 8 m[IU]/min via INTRAVENOUS
  Filled 2019-06-02 (×2): qty 1000

## 2019-06-02 MED ORDER — FENTANYL-BUPIVACAINE-NACL 0.5-0.125-0.9 MG/250ML-% EP SOLN
12.0000 mL/h | EPIDURAL | Status: DC | PRN
  Administered 2019-06-03: 07:00:00 12 mL/h via EPIDURAL
  Filled 2019-06-02 (×2): qty 250

## 2019-06-02 MED ORDER — LACTATED RINGERS IV SOLN
500.0000 mL | Freq: Once | INTRAVENOUS | Status: AC
  Administered 2019-06-02: 500 mL via INTRAVENOUS

## 2019-06-02 MED ORDER — OXYTOCIN BOLUS FROM INFUSION: 500.0000 mL | Freq: Once | INTRAVENOUS | Status: DC

## 2019-06-02 MED ORDER — ONDANSETRON HCL 4 MG/2ML IJ SOLN
4.0000 mg | Freq: Four times a day (QID) | INTRAMUSCULAR | Status: DC | PRN
  Administered 2019-06-02: 10:00:00 4 mg via INTRAVENOUS
  Filled 2019-06-02: qty 2

## 2019-06-02 MED ORDER — DIPHENHYDRAMINE HCL 50 MG/ML IJ SOLN: 12.5000 mg | INTRAMUSCULAR | Status: DC | PRN

## 2019-06-02 MED ORDER — SODIUM CHLORIDE (PF) 0.9 % IJ SOLN
INTRAMUSCULAR | Status: DC | PRN
  Administered 2019-06-02: 12 mL/h via EPIDURAL

## 2019-06-02 MED ORDER — SOD CITRATE-CITRIC ACID 500-334 MG/5ML PO SOLN
30.0000 mL | ORAL | Status: DC | PRN
  Administered 2019-06-03: 09:00:00 30 mL via ORAL
  Filled 2019-06-02: qty 30

## 2019-06-02 MED ORDER — LACTATED RINGERS IV SOLN
500.0000 mL | INTRAVENOUS | Status: DC | PRN
  Administered 2019-06-03: 500 mL via INTRAVENOUS

## 2019-06-02 MED ORDER — OXYCODONE-ACETAMINOPHEN 5-325 MG PO TABS: 1.0000 | ORAL_TABLET | ORAL | Status: DC | PRN

## 2019-06-02 MED ORDER — FENTANYL CITRATE (PF) 100 MCG/2ML IJ SOLN
100.0000 ug | INTRAMUSCULAR | Status: DC | PRN
  Administered 2019-06-02 (×3): 100 ug via INTRAVENOUS
  Filled 2019-06-02 (×3): qty 2

## 2019-06-02 MED ORDER — PHENYLEPHRINE 40 MCG/ML (10ML) SYRINGE FOR IV PUSH (FOR BLOOD PRESSURE SUPPORT): 80.0000 ug | PREFILLED_SYRINGE | INTRAVENOUS | Status: DC | PRN

## 2019-06-02 NOTE — MAU Note (Signed)
Labor check, pt and here earlier. VE before 3/70/-2

## 2019-06-02 NOTE — Progress Notes (Signed)
Victoria Glass is a 28 y.o. G2P0010 at [redacted]w[redacted]d admitted for active labor  Subjective: Pt comfortable with epidural, s/o at bedside for support  Objective: BP 117/72   Pulse 95   Temp 98.2 F (36.8 C) (Oral)   Resp 18   Ht 5\' 3"  (1.6 m)   Wt 97.1 kg   LMP 08/23/2018 (Exact Date)   SpO2 100%   BMI 37.93 kg/m  I/O last 3 completed shifts: In: 2707.8 [P.O.:540; I.V.:2167.8] Out: 1000 [Urine:700; Emesis/NG output:300] No intake/output data recorded.  FHT:  FHR: 135 bpm, variability: moderate,  accelerations:  Present,  decelerations:  Absent UC:   regular, every 2 minutes SVE:   Dilation: 6.5 Effacement (%): 100 Station: 0 Exam by:: Lattie Haw, CNM Large amount light meconium stained fluid noted during exam.  Labs: Lab Results  Component Value Date   WBC 9.6 06/02/2019   HGB 11.2 (L) 06/02/2019   HCT 34.7 (L) 06/02/2019   MCV 91.1 06/02/2019   PLT 324 06/02/2019    Assessment / Plan: Augmentation of labor, progressing well  Labor: Progressing normally Preeclampsia:  n/a Fetal Wellbeing:  Category I Pain Control:  Epidural I/D:  GBS neg Anticipated MOD:  NSVD  [name] 06/02/2019, 7:52 PM

## 2019-06-02 NOTE — Progress Notes (Signed)
Victoria Glass is a 28 y.o. G2P0010 at [redacted]w[redacted]d  admitted for active labor  Subjective: Pt comfortable with epidural.  S/O in room for support.  Objective: BP (!) 104/54   Pulse 90   Temp 98.6 F (37 C) (Oral)   Resp 18   Ht 5\' 3"  (1.6 m)   Wt 97.1 kg   LMP 08/23/2018 (Exact Date)   SpO2 100%   BMI 37.93 kg/m  No intake/output data recorded. Total I/O In: 2707.8 [P.O.:540; I.V.:2167.8] Out: 850 [Urine:550; Emesis/NG output:300]  FHT:  FHR: 135 bpm, variability: moderate,  accelerations:  Present,  decelerations:  Absent UC:   irregular, every 1-4 minutes SVE:   Dilation: 5 Effacement (%): 80 Station: -1 Exam by:: MIchelle, RN   Labs: Lab Results  Component Value Date   WBC 9.6 06/02/2019   HGB 11.2 (L) 06/02/2019   HCT 34.7 (L) 06/02/2019   MCV 91.1 06/02/2019   PLT 324 06/02/2019    Assessment / Plan: Arrest in active phase of labor  Labor: Increase Pitocin, RN to continue to reposition patient with peanut ball for improved descent/labor progress Preeclampsia:  n/a Fetal Wellbeing:  Category I Pain Control:  Epidural I/D:  GBS neg Anticipated MOD:  NSVD  [name] 06/02/2019, 6:57 PM

## 2019-06-02 NOTE — Anesthesia Procedure Notes (Addendum)
Epidural Patient location during procedure: OB Start time: 06/02/2019 1:56 PM End time: 06/02/2019 1:59 PM  Staffing Anesthesiologist: Lyn Hollingshead, MD Performed: anesthesiologist   Preanesthetic Checklist Completed: patient identified, site marked, surgical consent, pre-op evaluation, timeout performed, IV checked, risks and benefits discussed and monitors and equipment checked  Epidural Patient position: sitting Prep: site prepped and draped and DuraPrep Patient monitoring: continuous pulse ox and blood pressure Approach: midline Location: L3-L4 Injection technique: LOR air  Needle:  Needle type: Tuohy  Needle gauge: 17 G Needle length: 9 cm and 9 Needle insertion depth: 8 cm Catheter type: closed end flexible Catheter size: 19 Gauge Catheter at skin depth: 13 cm Test dose: negative and Other  Assessment Events: blood not aspirated, injection not painful, no injection resistance, negative IV test and no paresthesia  Additional Notes Reason for block:procedure for pain

## 2019-06-02 NOTE — Progress Notes (Signed)
Victoria Glass is a 28 y.o. G2P0010 at [redacted]w[redacted]d by LMP admitted for active labor  Subjective:   Objective: BP 114/66   Pulse 93   Temp 98.5 F (36.9 C) (Oral)   Resp 18   Ht 5\' 3"  (1.6 m)   Wt 97.1 kg   LMP 08/23/2018 (Exact Date)   SpO2 99%   BMI 37.93 kg/m  No intake/output data recorded. Total I/O In: 2448.1 [P.O.:540; I.V.:1908.1] Out: -   FHT:  FHR: 140 bpm, variability: moderate,  accelerations:  Present,  decelerations:  Absent UC:   regular, every 2-4 minutes SVE:   Dilation: 5.5 Effacement (%): 100 Station: -1 Exam by:: Victoria Glass, CNM AROM with clear fluid, IUPC placed without difficulty. Pt tolerated well. Positive scalp stimulation with accel during cervical exam  Labs: Lab Results  Component Value Date   WBC 9.6 06/02/2019   HGB 11.2 (L) 06/02/2019   HCT 34.7 (L) 06/02/2019   MCV 91.1 06/02/2019   PLT 324 06/02/2019    Assessment / Plan: Arrest in active phase of labor  Labor: Pt admitted for active labor, labored to 5 cm then no labor progress x 4-5 hours.  Pitocin started and contractions frequent but no change on current exam.  Discussed options including expectant management and AROM with IUPC.  Pt selects AROM and this is performed without complication.  Fetal position direct OA following AROM.  Preeclampsia:  n/a Fetal Wellbeing:  Category I Pain Control:  Epidural I/D:  GBS negative Anticipated MOD:  NSVD  Victoria Glass 06/02/2019, 2:39 PM

## 2019-06-02 NOTE — Progress Notes (Signed)
Labor Progress Note Victoria Glass is a 28 y.o. G2P0010 at [redacted]w[redacted]d presented for SOL.  S: Comfortable with epidural in place.  No new concerns.  O:  BP (!) 107/55 (BP Location: Left Arm)   Pulse 96   Temp 98.2 F (36.8 C) (Oral)   Resp 18   Ht 5\' 3"  (1.6 m)   Wt 97.1 kg   LMP 08/23/2018 (Exact Date)   SpO2 100%   BMI 37.93 kg/m  EFM: 135/moderate var/accels  CVE: Dilation: 6.5 Effacement (%): 100 Cervical Position: Middle Station: 0 Presentation: Vertex Exam by:: Lattie Haw, CNM   A&P: 28 y.o. G2P0010 [redacted]w[redacted]d here with SOL.  #Labor: Progressing well, now on Pit. #Pain: epidural in place. #FWB: Category I #GBS negative  Matilde Haymaker, MD 10:14 PM

## 2019-06-02 NOTE — Progress Notes (Signed)
Pt's visit was initiated by electronic request for HCPOA. I explained to pt we are not administering them presently because of COVID restrictions. She understood and was okay. Please page of additional assistance is needed. [place], [place]   06/02/19 1700  Clinical Encounter Type  Visited With Patient and family together

## 2019-06-02 NOTE — Anesthesia Preprocedure Evaluation (Signed)
Anesthesia Evaluation  Patient identified by MRN, date of birth, ID band Patient awake    Reviewed: Allergy & Precautions, H&P , NPO status , Patient's Chart, lab work & pertinent test results  Airway Mallampati: II  TM Distance: >3 FB Neck ROM: full    Dental no notable dental hx. (+) Teeth Intact   Pulmonary neg pulmonary ROS,    Pulmonary exam normal breath sounds clear to auscultation       Cardiovascular negative cardio ROS Normal cardiovascular exam Rhythm:regular Rate:Normal     Neuro/Psych Anxiety negative neurological ROS  negative psych ROS   GI/Hepatic negative GI ROS, Neg liver ROS,   Endo/Other  negative endocrine ROS  Renal/GU negative Renal ROS     Musculoskeletal   Abdominal (+) + obese,   Peds  Hematology negative hematology ROS (+)   Anesthesia Other Findings   Reproductive/Obstetrics (+) Pregnancy                             Anesthesia Physical Anesthesia Plan  ASA: II  Anesthesia Plan: Epidural   Post-op Pain Management:    Induction:   PONV Risk Score and Plan:   Airway Management Planned:   Additional Equipment:   Intra-op Plan:   Post-operative Plan:   Informed Consent: I have reviewed the patients History and Physical, chart, labs and discussed the procedure including the risks, benefits and alternatives for the proposed anesthesia with the patient or authorized representative who has indicated his/her understanding and acceptance.       Plan Discussed with:   Anesthesia Plan Comments:         Anesthesia Quick Evaluation

## 2019-06-02 NOTE — Plan of Care (Signed)

## 2019-06-02 NOTE — H&P (Addendum)
OBSTETRIC ADMISSION HISTORY AND PHYSICAL  Victoria Glass is a 28 y.o. female G2P0010 with IUP at 5117w3d by L/10 presenting for SOL. Bloddy show yesterday morning. Frequent contractions this past evening and went to MAU. Sent home and returned with worsened contractions and progressed cervical exam. She reports +FMs, No LOF, no blurry vision, headaches or peripheral edema, and RUQ pain.  She plans on breast feeding. She request OCPs for birth control. She received her prenatal [facility] at [facility]   Dating: By L/10 --->  Estimated Date of Delivery: 05/30/19  Sono:  @[redacted]w[redacted]d , CWD, normal anatomy, variable presentation, 261g, 42% EFW   Prenatal History/Complications: Obesity   Past Medical History: Past Medical History:  Diagnosis Date  . Medical history non-contributory   . Vaginal Pap smear, abnormal     Past Surgical History: Past Surgical History:  Procedure Laterality Date  . INDUCED ABORTION    . NO PAST SURGERIES      Obstetrical History: OB History    Gravida  2   Para      Term      Preterm      AB  1   Living        SAB      TAB  1   Ectopic      Multiple      Live Births              Social History: Social History   Socioeconomic History  . Marital status: Single    Spouse name: Not on file  . Number of children: Not on file  . Years of education: 3212  . Highest education level: Bachelor's degree (e.g., BA, AB, BS)  Occupational History  . Occupation: Retail buyerwaitress    Employer: OTHER  Social Needs  . Financial resource strain: Not hard at all  . Food insecurity    Worry: Never true    Inability: Never true  . Transportation needs    Medical: No    Non-medical: Not on file  Tobacco Use  . Smoking status: Never Smoker  . Smokeless tobacco: Never Used  Substance and Sexual Activity  . Alcohol use: Yes    Alcohol/week: 0.0 standard drinks    Comment: occasionally  . Drug use: Not Currently    Comment: last time   - 4 months ago  . Sexual  activity: Yes    Partners: Male    Birth control/protection: None  Lifestyle  . Physical activity    Days per week: 7 days    Minutes per session: 150+ min  . Stress: Not at all  Relationships  . Social connections    Talks on phone: More than three times a week    Gets together: Once a week    Attends religious service: 1 to 4 times per year    Active member of club or organization: No    Attends meetings of clubs or organizations: Never    Relationship status: Living with partner  Other Topics Concern  . Not on file  Social History Narrative  . Not on file    Family History: Family History  Problem Relation Age of Onset  . Drug abuse Maternal Aunt   . Alcohol abuse Maternal Uncle   . Hypertension Maternal Uncle   . Drug abuse Paternal Aunt   . Diabetes Maternal Grandmother   . Cancer Maternal Grandfather   . Heart disease Maternal Grandfather   . Hypertension Maternal Grandfather     Allergies: No  Known Allergies  Medications Prior to Admission  Medication Sig Dispense Refill Last Dose  . acetaminophen (TYLENOL) 500 MG tablet Take 500 mg by mouth every 6 (six) hours as needed for mild pain or moderate pain.     . Prenatal MV-Min-FA-Omega-3 (PRENATAL GUMMIES/DHA & FA) 0.4-32.5 MG CHEW Chew by mouth.        Review of Systems   All systems reviewed and negative except as stated in HPI  Blood pressure 121/70, pulse 87, temperature 98.7 F (37.1 C), temperature source Oral, resp. rate 15, height 5\' 3"  (1.6 m), weight 97.1 kg, last menstrual period 08/23/2018, SpO2 100 %. General appearance: alert, cooperative and appears stated age Lungs: clear to auscultation bilaterally Heart: regular rate and rhythm Abdomen: soft, non-tender; bowel sounds normal Pelvic: 5/70/-1 Presentation: cephalic Fetal monitoringBaseline: 130 bpm, Variability: Good {> 6 bpm) and Accelerations: Reactive Uterine activityFrequency: Every 5 minutes Dilation: 5 Effacement (%): 80 Station:  -1 Exam by:: Dr. Matilde Haymaker   Prenatal labs: ABO, Rh: --/--/A POS, A POS Performed at [facility], [place] [address] [place], [place] [address]  [phone]) Antibody: NEG (08/22 0132) Rubella: 4.04 (01/23 1059) RPR: Non Reactive (05/27 0911)  HBsAg: Negative (01/23 1059)  HIV: Non Reactive (05/27 0911)  GBS:    2 hr Glucola 86/27/105 Genetic screening  Low risk Anatomy US normal female  Prenatal Transfer Tool  Maternal Diabetes: No Genetic Screening: Normal Maternal Ultrasounds/Referrals: Normal Fetal Ultrasounds or other Referrals:  None Maternal Substance Abuse:  No Significant Maternal Medications:  None Significant Maternal Lab Results: None  Results for orders placed or performed during the hospital encounter of 06/02/19 (from the past 24 hour(s))  CBC   Collection Time: 06/02/19  1:32 AM  Result Value Ref Range   WBC 9.6 4.0 - 10.5 K/uL   RBC 3.81 (L) 3.87 - 5.11 MIL/uL   Hemoglobin 11.2 (L) 12.0 - 15.0 g/dL   HCT 34.7 (L) 36.0 - 46.0 %   MCV 91.1 80.0 - 100.0 fL   MCH 29.4 26.0 - 34.0 pg   MCHC 32.3 30.0 - 36.0 g/dL   RDW 15.0 11.5 - 15.5 %   Platelets 324 150 - 400 K/uL   nRBC 0.0 0.0 - 0.2 %  Type and screen   Collection Time: 06/02/19  1:32 AM  Result Value Ref Range   ABO/RH(D) A POS    Antibody Screen NEG    Sample Expiration      06/05/2019,2359 Performed at [facility], [place] [address] [place], [place] [address]   ABO/Rh   Collection Time: 06/02/19  1:32 AM  Result Value Ref Range   ABO/RH(D)      A POS Performed at [place] [address], [place], [place] [address]   SARS Coronavirus 2 [facility] order, Performed in [facility] hospital lab) Nasopharyngeal Nasopharyngeal Swab   Collection Time: 06/02/19  1:42 AM   Specimen: Nasopharyngeal Swab  Result Value Ref Range   SARS Coronavirus 2 NEGATIVE NEGATIVE    Patient Active Problem List   Diagnosis Date Noted  . Encounter for induction of labor 06/02/2019  .  Supervision of other normal pregnancy, antepartum 11/02/2018  . Obesity during pregnancy, antepartum 11/02/2018  . Low grade squamous intraepith lesion on cytologic smear cervix (lgsil) 08/29/2017  . Obese 01/01/2016    Assessment/Plan:  Victoria Glass is a 28 y.o. G2P0010 at [redacted]w[redacted]d here for SOL.  #Labor: progressing without augmentation.  #Pain: Fentanyl for now.  Epidural upon request. #FWB: Category I #ID:  GBS neg #MOF: breast #MOC:OCPs #Circ:  N/A  Mirian MoPeter Frank, MD  06/02/2019, 4:39 AM  CNM attestation:  I have seen and examined this patient; I agree with above documentation in the resident's note.   Victoria Glass is a 28 y.o. G2P0010 here for latent labor.  PE: BP 125/78   Pulse (!) 104   Temp 98.2 F (36.8 C) (Oral)   Resp 18   Ht 5\' 3"  (1.6 m)   Wt 97.1 kg   LMP 08/23/2018 (Exact Date)   SpO2 100%   BMI 37.93 kg/m   Resp: normal effort, no distress Abd: gravid  ROS, labs, PMH reviewed  Plan: Admit to Labor and Delivery Expectant management Anticipate vag del  Arabella MerlesKimberly D Gretna Bergin CNM 06/02/2019, 10:22 AM

## 2019-06-03 ENCOUNTER — Encounter (HOSPITAL_COMMUNITY)
Admission: AD | Disposition: A | Discharge status: Home / Self Care | Payer: Self-pay | Source: Home / Self Care | Attending: Obstetrics & Gynecology

## 2019-06-03 ENCOUNTER — Encounter (HOSPITAL_COMMUNITY): Payer: Self-pay

## 2019-06-03 DIAGNOSIS — Z3A40 40 weeks gestation of pregnancy: Secondary | ICD-10-CM

## 2019-06-03 DIAGNOSIS — O480 Post-term pregnancy: Secondary | ICD-10-CM

## 2019-06-03 SURGERY — Surgical Case
Anesthesia: Epidural | Site: Abdomen

## 2019-06-03 MED ORDER — NALOXONE HCL 4 MG/10ML IJ SOLN
1.0000 ug/kg/h | INTRAVENOUS | Status: DC | PRN
  Filled 2019-06-03: qty 5

## 2019-06-03 MED ORDER — NALOXONE HCL 0.4 MG/ML IJ SOLN: 0.4000 mg | INTRAMUSCULAR | Status: DC | PRN

## 2019-06-03 MED ORDER — SIMETHICONE 80 MG PO CHEW
80.0000 mg | CHEWABLE_TABLET | Freq: Three times a day (TID) | ORAL | Status: DC
  Administered 2019-06-03 – 2019-06-05 (×5): 80 mg via ORAL
  Filled 2019-06-03 (×6): qty 1

## 2019-06-03 MED ORDER — LIDOCAINE-EPINEPHRINE (PF) 2 %-1:200000 IJ SOLN
INTRAMUSCULAR | Status: DC | PRN
  Administered 2019-06-03 (×3): 5 mL via EPIDURAL

## 2019-06-03 MED ORDER — DIPHENHYDRAMINE HCL 25 MG PO CAPS: 25.0000 mg | ORAL_CAPSULE | ORAL | Status: DC | PRN

## 2019-06-03 MED ORDER — LIDOCAINE 2% (20 MG/ML) 5 ML SYRINGE
INTRAMUSCULAR | Status: AC
  Filled 2019-06-03: qty 5

## 2019-06-03 MED ORDER — COCONUT OIL OIL
1.0000 "application " | TOPICAL_OIL | Status: DC | PRN
  Administered 2019-06-04: 1 via TOPICAL

## 2019-06-03 MED ORDER — LACTATED RINGERS IV SOLN: INTRAVENOUS | Status: DC

## 2019-06-03 MED ORDER — SODIUM BICARBONATE 8.4 % IV SOLN
INTRAVENOUS | Status: AC
  Filled 2019-06-03: qty 50

## 2019-06-03 MED ORDER — ONDANSETRON HCL 4 MG/2ML IJ SOLN
INTRAMUSCULAR | Status: DC | PRN
  Administered 2019-06-03: 4 mg via INTRAVENOUS

## 2019-06-03 MED ORDER — KETOROLAC TROMETHAMINE 30 MG/ML IJ SOLN: 30.0000 mg | Freq: Once | INTRAMUSCULAR | Status: DC | PRN

## 2019-06-03 MED ORDER — MEPERIDINE HCL 25 MG/ML IJ SOLN
INTRAMUSCULAR | Status: AC
  Filled 2019-06-03: qty 1

## 2019-06-03 MED ORDER — METHYLERGONOVINE MALEATE 0.2 MG PO TABS: 0.2000 mg | ORAL_TABLET | ORAL | Status: DC | PRN

## 2019-06-03 MED ORDER — OXYCODONE HCL 5 MG PO TABS: 5.0000 mg | ORAL_TABLET | Freq: Once | ORAL | Status: DC | PRN

## 2019-06-03 MED ORDER — ONDANSETRON HCL 4 MG/2ML IJ SOLN: 4.0000 mg | Freq: Once | INTRAMUSCULAR | Status: DC | PRN

## 2019-06-03 MED ORDER — CEFAZOLIN SODIUM-DEXTROSE 2-4 GM/100ML-% IV SOLN
INTRAVENOUS | Status: AC
  Filled 2019-06-03: qty 100

## 2019-06-03 MED ORDER — DEXAMETHASONE SODIUM PHOSPHATE 10 MG/ML IJ SOLN
INTRAMUSCULAR | Status: DC | PRN
  Administered 2019-06-03: 10 mg via INTRAVENOUS

## 2019-06-03 MED ORDER — TETANUS-DIPHTH-ACELL PERTUSSIS 5-2.5-18.5 LF-MCG/0.5 IM SUSP
0.5000 mL | Freq: Once | INTRAMUSCULAR | Status: AC
  Administered 2019-06-04: 0.5 mL via INTRAMUSCULAR
  Filled 2019-06-03: qty 0.5

## 2019-06-03 MED ORDER — PHENYLEPHRINE 40 MCG/ML (10ML) SYRINGE FOR IV PUSH (FOR BLOOD PRESSURE SUPPORT)
PREFILLED_SYRINGE | INTRAVENOUS | Status: DC | PRN
  Administered 2019-06-03 (×5): 120 ug via INTRAVENOUS

## 2019-06-03 MED ORDER — NALBUPHINE HCL 10 MG/ML IJ SOLN: 5.0000 mg | Freq: Once | INTRAMUSCULAR | Status: DC | PRN

## 2019-06-03 MED ORDER — CEFAZOLIN SODIUM-DEXTROSE 2-3 GM-%(50ML) IV SOLR
INTRAVENOUS | Status: DC | PRN
  Administered 2019-06-03: 2 g via INTRAVENOUS

## 2019-06-03 MED ORDER — MEPERIDINE HCL 25 MG/ML IJ SOLN: 6.2500 mg | INTRAMUSCULAR | Status: DC | PRN

## 2019-06-03 MED ORDER — MORPHINE SULFATE (PF) 0.5 MG/ML IJ SOLN
INTRAMUSCULAR | Status: AC
  Filled 2019-06-03: qty 10

## 2019-06-03 MED ORDER — OXYCODONE HCL 5 MG PO TABS
5.0000 mg | ORAL_TABLET | ORAL | Status: DC | PRN
  Administered 2019-06-03 – 2019-06-04 (×2): 5 mg via ORAL
  Filled 2019-06-03 (×2): qty 1

## 2019-06-03 MED ORDER — OXYTOCIN 40 UNITS IN NORMAL SALINE INFUSION - SIMPLE MED
INTRAVENOUS | Status: AC
  Filled 2019-06-03: qty 1000

## 2019-06-03 MED ORDER — PRENATAL MULTIVITAMIN CH
1.0000 | ORAL_TABLET | Freq: Every day | ORAL | Status: DC
  Administered 2019-06-04 – 2019-06-05 (×2): 1 via ORAL
  Filled 2019-06-03 (×2): qty 1

## 2019-06-03 MED ORDER — NALBUPHINE HCL 10 MG/ML IJ SOLN: 5.0000 mg | INTRAMUSCULAR | Status: DC | PRN

## 2019-06-03 MED ORDER — OXYTOCIN 40 UNITS IN NORMAL SALINE INFUSION - SIMPLE MED: 2.5000 [IU]/h | INTRAVENOUS | Status: AC

## 2019-06-03 MED ORDER — DIPHENHYDRAMINE HCL 50 MG/ML IJ SOLN
25.0000 mg | Freq: Once | INTRAMUSCULAR | Status: DC
  Filled 2019-06-03: qty 1

## 2019-06-03 MED ORDER — ONDANSETRON HCL 4 MG/2ML IJ SOLN
INTRAMUSCULAR | Status: AC
  Filled 2019-06-03: qty 2

## 2019-06-03 MED ORDER — DIPHENHYDRAMINE HCL 50 MG/ML IJ SOLN: 12.5000 mg | INTRAMUSCULAR | Status: DC | PRN

## 2019-06-03 MED ORDER — SODIUM CHLORIDE 0.9% FLUSH: 3.0000 mL | INTRAVENOUS | Status: DC | PRN

## 2019-06-03 MED ORDER — SODIUM CHLORIDE 0.9 % IV SOLN
INTRAVENOUS | Status: DC | PRN
  Administered 2019-06-03: 10:00:00 via INTRAVENOUS

## 2019-06-03 MED ORDER — WITCH HAZEL-GLYCERIN EX PADS: 1.0000 "application " | MEDICATED_PAD | CUTANEOUS | Status: DC | PRN

## 2019-06-03 MED ORDER — ONDANSETRON HCL 4 MG/2ML IJ SOLN: 4.0000 mg | Freq: Three times a day (TID) | INTRAMUSCULAR | Status: DC | PRN

## 2019-06-03 MED ORDER — DEXAMETHASONE SODIUM PHOSPHATE 10 MG/ML IJ SOLN
INTRAMUSCULAR | Status: AC
  Filled 2019-06-03: qty 1

## 2019-06-03 MED ORDER — MORPHINE SULFATE (PF) 0.5 MG/ML IJ SOLN
INTRAMUSCULAR | Status: DC | PRN
  Administered 2019-06-03: 3 mg via EPIDURAL
  Administered 2019-06-03: 2 mg via INTRAVENOUS

## 2019-06-03 MED ORDER — METHYLERGONOVINE MALEATE 0.2 MG/ML IJ SOLN: 0.2000 mg | INTRAMUSCULAR | Status: DC | PRN

## 2019-06-03 MED ORDER — SIMETHICONE 80 MG PO CHEW
80.0000 mg | CHEWABLE_TABLET | ORAL | Status: DC
  Administered 2019-06-03 – 2019-06-05 (×3): 80 mg via ORAL
  Filled 2019-06-03 (×2): qty 1

## 2019-06-03 MED ORDER — SCOPOLAMINE 1 MG/3DAYS TD PT72: 1.0000 | MEDICATED_PATCH | Freq: Once | TRANSDERMAL | Status: DC

## 2019-06-03 MED ORDER — ZOLPIDEM TARTRATE 5 MG PO TABS: 5.0000 mg | ORAL_TABLET | Freq: Every evening | ORAL | Status: DC | PRN

## 2019-06-03 MED ORDER — PHENYLEPHRINE 40 MCG/ML (10ML) SYRINGE FOR IV PUSH (FOR BLOOD PRESSURE SUPPORT)
PREFILLED_SYRINGE | INTRAVENOUS | Status: AC
  Filled 2019-06-03: qty 10

## 2019-06-03 MED ORDER — LIDOCAINE-EPINEPHRINE (PF) 2 %-1:200000 IJ SOLN
INTRAMUSCULAR | Status: AC
  Filled 2019-06-03: qty 10

## 2019-06-03 MED ORDER — DIBUCAINE (PERIANAL) 1 % EX OINT: 1.0000 "application " | TOPICAL_OINTMENT | CUTANEOUS | Status: DC | PRN

## 2019-06-03 MED ORDER — SIMETHICONE 80 MG PO CHEW: 80.0000 mg | CHEWABLE_TABLET | ORAL | Status: DC | PRN

## 2019-06-03 MED ORDER — MEPERIDINE HCL 25 MG/ML IJ SOLN
INTRAMUSCULAR | Status: DC | PRN
  Administered 2019-06-03 (×2): 12.5 mg via INTRAVENOUS

## 2019-06-03 MED ORDER — SENNOSIDES-DOCUSATE SODIUM 8.6-50 MG PO TABS
2.0000 | ORAL_TABLET | ORAL | Status: DC
  Administered 2019-06-03 – 2019-06-04 (×2): 2 via ORAL
  Filled 2019-06-03 (×2): qty 2

## 2019-06-03 MED ORDER — DIPHENHYDRAMINE HCL 25 MG PO CAPS: 25.0000 mg | ORAL_CAPSULE | Freq: Four times a day (QID) | ORAL | Status: DC | PRN

## 2019-06-03 MED ORDER — SODIUM CHLORIDE 0.9 % IV SOLN
INTRAVENOUS | Status: DC | PRN
  Administered 2019-06-03: 40 [IU] via INTRAVENOUS

## 2019-06-03 MED ORDER — ENOXAPARIN SODIUM 40 MG/0.4ML ~~LOC~~ SOLN
40.0000 mg | SUBCUTANEOUS | Status: DC
  Administered 2019-06-04 – 2019-06-05 (×2): 40 mg via SUBCUTANEOUS
  Filled 2019-06-03 (×2): qty 0.4

## 2019-06-03 MED ORDER — OXYCODONE HCL 5 MG/5ML PO SOLN: 5.0000 mg | Freq: Once | ORAL | Status: DC | PRN

## 2019-06-03 MED ORDER — FENTANYL CITRATE (PF) 100 MCG/2ML IJ SOLN: 25.0000 ug | INTRAMUSCULAR | Status: DC | PRN

## 2019-06-03 MED ORDER — MENTHOL 3 MG MT LOZG: 1.0000 | LOZENGE | OROMUCOSAL | Status: DC | PRN

## 2019-06-03 SURGICAL SUPPLY — 39 items
CHLORAPREP W/TINT 26ML (MISCELLANEOUS) ×3 IMPLANT
CLAMP CORD UMBIL (MISCELLANEOUS) IMPLANT
CLOTH BEACON ORANGE TIMEOUT ST (SAFETY) ×3 IMPLANT
DERMABOND ADVANCED (GAUZE/BANDAGES/DRESSINGS) ×2
DERMABOND ADVANCED .7 DNX12 (GAUZE/BANDAGES/DRESSINGS) ×2 IMPLANT
DRSG OPSITE POSTOP 4X10 (GAUZE/BANDAGES/DRESSINGS) ×3 IMPLANT
ELECT REM PT RETURN 9FT ADLT (ELECTROSURGICAL) ×3
ELECTRODE REM PT RTRN 9FT ADLT (ELECTROSURGICAL) ×1 IMPLANT
EXTRACTOR VACUUM BELL STYLE (SUCTIONS) IMPLANT
GLOVE BIOGEL PI IND STRL 7.0 (GLOVE) ×1 IMPLANT
GLOVE BIOGEL PI IND STRL 8 (GLOVE) ×1 IMPLANT
GLOVE BIOGEL PI INDICATOR 7.0 (GLOVE) ×2
GLOVE BIOGEL PI INDICATOR 8 (GLOVE) ×2
GLOVE ECLIPSE 8.0 STRL XLNG CF (GLOVE) ×3 IMPLANT
GOWN STRL REUS W/TWL LRG LVL3 (GOWN DISPOSABLE) ×6 IMPLANT
KIT ABG SYR 3ML LUER SLIP (SYRINGE) ×3 IMPLANT
NDL HYPO 18GX1.5 BLUNT FILL (NEEDLE) ×1 IMPLANT
NDL HYPO 25X5/8 SAFETYGLIDE (NEEDLE) ×1 IMPLANT
NEEDLE HYPO 18GX1.5 BLUNT FILL (NEEDLE) ×3 IMPLANT
NEEDLE HYPO 22GX1.5 SAFETY (NEEDLE) ×3 IMPLANT
NEEDLE HYPO 25X5/8 SAFETYGLIDE (NEEDLE) ×3 IMPLANT
NS IRRIG 1000ML POUR BTL (IV SOLUTION) ×3 IMPLANT
PACK C SECTION WH (CUSTOM PROCEDURE TRAY) ×3 IMPLANT
PAD OB MATERNITY 4.3X12.25 (PERSONAL CARE ITEMS) ×3 IMPLANT
PENCIL SMOKE EVAC W/HOLSTER (ELECTROSURGICAL) ×3 IMPLANT
RTRCTR C-SECT PINK 25CM LRG (MISCELLANEOUS) IMPLANT
SUT CHROMIC 0 CT 1 (SUTURE) ×3 IMPLANT
SUT MNCRL 0 VIOLET CTX 36 (SUTURE) ×2 IMPLANT
SUT MONOCRYL 0 CTX 36 (SUTURE) ×8
SUT PLAIN 2 0 (SUTURE)
SUT PLAIN 2 0 XLH (SUTURE) IMPLANT
SUT PLAIN ABS 2-0 CT1 27XMFL (SUTURE) IMPLANT
SUT VIC AB 0 CTX 36 (SUTURE) ×2
SUT VIC AB 0 CTX36XBRD ANBCTRL (SUTURE) ×1 IMPLANT
SUT VIC AB 4-0 KS 27 (SUTURE) IMPLANT
SYR 20CC LL (SYRINGE) ×6 IMPLANT
TOWEL OR 17X24 6PK STRL BLUE (TOWEL DISPOSABLE) ×3 IMPLANT
TRAY FOLEY W/BAG SLVR 14FR LF (SET/KITS/TRAYS/PACK) IMPLANT
WATER STERILE IRR 1000ML POUR (IV SOLUTION) ×3 IMPLANT

## 2019-06-03 NOTE — Progress Notes (Signed)
Victoria Glass is a 28 y.o. G2P0010 at [redacted]w[redacted]d by  admitted for active labor  Subjective:   Objective: BP 118/76   Pulse (!) 101   Temp 98.9 F (37.2 C) (Oral)   Resp 18   Ht 5\' 3"  (1.6 m)   Wt 97.1 kg   LMP 08/23/2018 (Exact Date)   SpO2 100%   BMI 37.93 kg/m  I/O last 3 completed shifts: In: 2707.8 [P.O.:540; I.V.:2167.8] Out: 2100 [Urine:1800; Emesis/NG output:300] Total I/O In: 2430.6 [I.V.:2430.6] Out: 100 [Urine:100]  FHT:  Reactive FHR tracing no decels UC:   Mild regular SVE:   Dilation: 7 Effacement (%): 90 Station: -2 Exam by:: Victoria Mylar, RN  Labs: Lab Results  Component Value Date   WBC 9.6 06/02/2019   HGB 11.2 (L) 06/02/2019   HCT 34.7 (L) 06/02/2019   MCV 91.1 06/02/2019   PLT 324 06/02/2019    Assessment / Plan: Arrest in active phase of labor Previous period of NRFHRT requiring stopping pitocin  Labor: arrested Preeclampsia:   Fetal Wellbeing:  Category I Pain Control:  Epidural I/D:   Anticipated MOD:  Proceed with primary C section  No cervical change in 12 hours, inadequate labor on pitocin was stopped earlier due to fetal intolerance, pt desires to proceed with primary Caesarean section  Victoria Glass 06/03/2019, 9:03 AM

## 2019-06-03 NOTE — Addendum Note (Signed)
Addendum  created 06/03/19 1652 by [name], MD   Attestation recorded in [place], [place] filed

## 2019-06-03 NOTE — Progress Notes (Signed)
Labor Progress Note Leea Rambeau is a 28 y.o. G2P0010 at [redacted]w[redacted]d presented for SOL.  S: Resting in bed comfortably listening to music. No new complaints.  O:  BP (!) 91/56   Pulse 93   Temp 98.8 F (37.1 C) (Oral)   Resp 18   Ht 5\' 3"  (1.6 m)   Wt 97.1 kg   LMP 08/23/2018 (Exact Date)   SpO2 100%   BMI 37.93 kg/m  EFM: 135/moderate var/accels, few variables in the past 2 hours  CVE: Dilation: 8 Effacement (%): 90 Cervical Position: Middle Station: Plus 1 Presentation: Vertex Exam by:: Dr. Pilar Plate, Resident   A&P: 28 y.o. G2P0010 [redacted]w[redacted]d here with SOL.  #Labor: Progressing well on pit.  Continue pit for now. #Pain: epidural in place. #FWB: Category II, reassuring. #GBS negative  Matilde Haymaker, MD 2:36 AM

## 2019-06-03 NOTE — Transfer of Care (Signed)
Immediate Anesthesia Transfer of Care Note  Patient: Victoria Glass  Procedure(s) Performed: CESAREAN SECTION (N/A Abdomen)  Patient Location: PACU  Anesthesia Type:Epidural  Level of Consciousness: sedated  Airway & Oxygen Therapy: Patient Spontanous Breathing  Post-op Assessment: Report given to RN  Post vital signs: Reviewed and stable  Last Vitals:  Vitals Value Taken Time  BP 73/53 06/03/19 1036  Temp    Pulse 113 06/03/19 1039  Resp 22 06/03/19 1039  SpO2 100 % 06/03/19 1039  Vitals shown include unvalidated device data.  Last Pain:  Vitals:   06/03/19 0845  TempSrc: Oral  PainSc:       Patients Stated Pain Goal: 8 (19/16/60 6004)  Complications: No apparent anesthesia complications

## 2019-06-03 NOTE — Progress Notes (Signed)
Pt refusing Ice glove and IV benadryl. Pt stating she wants a c-section. Marcille Buffy, CNM and Len Blalock, CNM made aware. CNMs to update MD at sign out. Will continue to monitor.

## 2019-06-03 NOTE — Anesthesia Postprocedure Evaluation (Signed)
Anesthesia Post Note  Patient: Victoria Glass  Procedure(s) Performed: CESAREAN SECTION (N/A Abdomen)     Patient location during evaluation: PACU Anesthesia Type: Epidural Level of consciousness: oriented and awake and alert Pain management: pain level controlled Vital Signs Assessment: post-procedure vital signs reviewed and stable Respiratory status: spontaneous breathing, respiratory function stable and nonlabored ventilation Cardiovascular status: blood pressure returned to baseline and stable Postop Assessment: no headache, no backache, no apparent nausea or vomiting and epidural receding Anesthetic complications: no    Last Vitals:  Vitals:   06/03/19 1226 06/03/19 1332  BP: 111/60 114/67  Pulse: 98 98  Resp: 18 18  Temp: 37.9 C 37.7 C  SpO2: 100% 94%    Last Pain:  Vitals:   06/03/19 1449  TempSrc:   PainSc: 4    Pain Goal: Patients Stated Pain Goal: 8 (06/02/19 1139)                 [name]

## 2019-06-03 NOTE — Op Note (Signed)
Preoperative diagnosis:  1.  Intrauterine pregnancy at [redacted]w[redacted]d  weeks gestation                                         2.  Secondary arrest of descent dilatation in the first stage of labor                                         3.  Fetal intolerance of adequate labor earlier   Postoperative diagnosis:  Same as above plus occiput posterior presentation  Procedure:  Primary cesarean section, low transverse  Surgeon:  [name] MD  Assistant:    Anesthesia: Spinal  Findings:  Cervical exam was unchanged for approximately 14 hours, unable to get into adequate labor with pitocin and when approached adequate labor fetus did not tolerate and pitocin was turned off for a time and the fetus indeed recovered.  Pitocin restarted but after discussion with patient she opted for proceeding with a primary Caesarean section  Over a low transverse incision was delivered a viable female with Apgars of 9 and 9 weighing pending lbs.  oz. Uterus, tubes and ovaries were all normal.  There were no other significant findings  Description of operation:  Patient was taken to the operating room where she underwent dosing of her epidural anesthetic. She was then placed in the supine position with tilt to the left side. When adequate anesthetic level was obtained she was prepped and draped in usual sterile fashion and a Foley catheter was placed. A Pfannenstiel skin incision was made and carried down sharply to the rectus fascia which was scored in the midline extended laterally. The fascia was taken off the muscles both superiorly and without difficulty. The muscles were divided.  The peritoneal cavity was entered.  Bladder blade was placed, no bladder flap was created.  A low transverse hysterotomy incision was made and delivered a viable female  infant at 22 with Apgars of 9 and 9 weighing pending lbs  oz.  Cord pH was obtained and was pending. The uterus was exteriorized. It was closed in 2 layers, the first being  a running interlocking layer and the second being an imbricating layer using 0 monocryl on a CTX needle. There was good resulting hemostasis. The uterus tubes and ovaries were all normal. Peritoneal cavity was irrigated vigorously. The muscles and peritoneum were reapproximated loosely. The fascia was closed using 0 Vicryl in running fashion. Subcutaneous tissue was made hemostatic and irrigated. The skin was closed using 4-0 Vicryl on a Keith needle in a subcuticular fashion.  Dermabond was placed for additional wound integrity and to serve as a barrier. Blood loss for the procedure was 936 cc. The patient received 2 grams of Ancef prophylactically. The patient was taken to the recovery room in good stable condition with all counts being correct x3.  EBL 936 cc  [name] 06/03/2019 10:51 AM

## 2019-06-04 ENCOUNTER — Other Ambulatory Visit (HOSPITAL_COMMUNITY)
Admission: RE | Admit: 2019-06-04 | Discharge: 2019-06-04 | Disposition: A | Discharge status: Home / Self Care | Payer: BC Managed Care – PPO | Source: Ambulatory Visit

## 2019-06-04 LAB — CBC
HCT: 23.4 % — ABNORMAL LOW (ref 36.0–46.0)
Hemoglobin: 7.6 g/dL — ABNORMAL LOW (ref 12.0–15.0)
MCH: 29.6 pg (ref 26.0–34.0)
MCHC: 32.5 g/dL (ref 30.0–36.0)
MCV: 91.1 fL (ref 80.0–100.0)
Platelets: 256 10*3/uL (ref 150–400)
RBC: 2.57 MIL/uL — ABNORMAL LOW (ref 3.87–5.11)
RDW: 15.2 % (ref 11.5–15.5)
WBC: 15 10*3/uL — ABNORMAL HIGH (ref 4.0–10.5)
nRBC: 0 % (ref 0.0–0.2)

## 2019-06-04 MED ORDER — PHENYLEPHRINE 40 MCG/ML (10ML) SYRINGE FOR IV PUSH (FOR BLOOD PRESSURE SUPPORT)
PREFILLED_SYRINGE | INTRAVENOUS | Status: AC
  Filled 2019-06-04: qty 30

## 2019-06-04 MED ORDER — ACETAMINOPHEN 325 MG PO TABS
650.0000 mg | ORAL_TABLET | Freq: Four times a day (QID) | ORAL | Status: DC
  Administered 2019-06-04 – 2019-06-05 (×5): 650 mg via ORAL
  Filled 2019-06-04 (×5): qty 2

## 2019-06-04 MED ORDER — IBUPROFEN 200 MG PO TABS
400.0000 mg | ORAL_TABLET | Freq: Three times a day (TID) | ORAL | Status: DC
  Administered 2019-06-04 – 2019-06-05 (×5): 400 mg via ORAL
  Filled 2019-06-04 (×5): qty 2

## 2019-06-04 NOTE — Progress Notes (Signed)
Post Partum Day 1 Subjective: Victoria Glass is a 28 y.o. G2P0010 PPD#1 at [redacted]w[redacted]d. Pt doing well, feels a little sore in lower abdomen. Has only been taking Oxy as tylenol and ibuprofen not on chart. Lochia: appropriate, no clots. Changed yellow pad X 3. Breast feeding baby. Mobilizing well, eating and drinking well. Passing urine, no urinary sx. Has not had bowel movement yet.   Denies chest pain, SOB, fevers, LE edema.   Objective: Blood pressure 101/62, pulse (!) 108, temperature 98.1 F (36.7 C), temperature source Oral, resp. rate 18, height 5\' 3"  (1.6 m), weight 97.1 kg, last menstrual period 08/23/2018, SpO2 98 %, unknown if currently breastfeeding.  Physical Exam:  General: alert, cooperative and appears stated age comfortable, no distress Cardiac: RRR, no rubs or gallops, cap refill normal Pulm: lungs clear on auscultation, no crackles or wheeze, no respiratory distress GI: Abdo soft, mildly tender near incision site, no guarding, bowel sounds present  Lochia: appropriate Uterine Fundus: firm Incision: healing well, no significant drainage DVT Evaluation: No cords or calf tenderness. No significant calf/ankle edema.  Recent Labs    06/02/19 0132 06/04/19 0520  HGB 11.2* 7.6*  HCT 34.7* 23.4*    Assessment/Plan: Victoria Glass is a 28 y.o. G2P0010 PPD#1 at [redacted]w[redacted]d is doing well post op. #Analgesia- scheduled tylenol and ibuprofen #Feeding: breast #Contraception: COCP #Dispo: plan for 25th or 26th   LOS: 2 days   Danecia Underdown 06/04/2019, 9:49 AM

## 2019-06-04 NOTE — Lactation Note (Signed)
This note was copied from a baby's chart. Lactation Consultation Note Baby 72 hrs old. Mom states BF going well, baby prefers Rt. Breast. LC assisted in latching to Lt. Breast. Encouraged mom to stimulate short shaft nipples more for deeper latch.  Discussed positioning, props, support, safety, hand expression, STS, I&O, supply and demand while feeding. Assisted in football position to Lt. Breast. Heard occasional swallows. Hand expression demonstrated colostrum. Mom encouraged to feed baby 8-12 times/24 hours and with feeding cues if baby hasn't cued in 3 hrs, stimulate baby to feed. Encouraged to call for assistance or questions.  Lactation brochure given. Mom agreed to do [facility] trial.  Patient Name: Victoria Glass IEPPI'R Date: 06/04/2019 Reason for consult: Initial assessment;Primapara;Term   Maternal Data Has patient been taught Hand Expression?: Yes Does the patient have breastfeeding experience prior to this delivery?: No  Feeding Feeding Type: Breast Fed  LATCH Score Latch: Grasps breast easily, tongue down, lips flanged, rhythmical sucking.  Audible Swallowing: A few with stimulation  Type of Nipple: Everted at rest and after stimulation  Comfort (Breast/Nipple): Soft / non-tender  Hold (Positioning): Assistance needed to correctly position infant at breast and maintain latch.  LATCH Score: 8  Interventions Interventions: Breast feeding basics reviewed;Adjust position;Assisted with latch;Support pillows;Skin to skin;Position options;Breast massage;Hand express;Breast compression  Lactation Tools Discussed/Used WIC Program: No   Consult Status Consult Status: Follow-up Date: 06/05/19 Follow-up type: In-patient    Jayquan Bradsher, Elta Gu[place] 06/04/2019, 2:52 AM

## 2019-06-04 NOTE — Progress Notes (Signed)
    PRENATAL VISIT NOTE  Subjective:  Victoria Glass is a 28 y.o. G2P1011 at [redacted]w[redacted]d being seen today for ongoing prenatal care.  She is currently monitored for the following issues for this low-risk pregnancy and has Obese; Low grade squamous intraepith lesion on cytologic smear cervix (lgsil); Supervision of other normal pregnancy, antepartum; Obesity during pregnancy, antepartum; and Encounter for induction of labor on their problem list.  Patient reports no complaints.  Contractions: Irregular. Vag. Bleeding: Bloody Show.  Movement: Present. Denies leaking of fluid.   The following portions of the patient's history were reviewed and updated as appropriate: allergies, current medications, past family history, past medical history, past social history, past surgical history and problem list.   Objective:   Vitals:   06/01/19 1003  BP: 122/80  Pulse: 92  Weight: 214 lb 1.6 oz (97.1 kg)    Fetal Status: Fetal Heart Rate (bpm): 140   Movement: Present  Presentation: Vertex  General:  Alert, oriented and cooperative. Patient is in no acute distress.  Skin: Skin is warm and dry. No rash noted.   Cardiovascular: Normal heart rate noted  Respiratory: Normal respiratory effort, no problems with respiration noted  Abdomen: Soft, gravid, appropriate for gestational age.  Pain/Pressure: Present     Pelvic: Cervical exam performed Dilation: 2 Effacement (%): 70 Station: -3  Extremities: Normal range of motion.  Edema: None  Mental Status: Normal mood and affect. Normal behavior. Normal judgment and thought content.   Assessment and Plan:  Pregnancy: [id number] at [redacted]w[redacted]d 1. Post term pregnancy over 40 weeks NST:  Baseline: 135 bpm, Variability: Good {> 6 bpm), Accelerations: Reactive and Decelerations: Absent   - Fetal nonstress test  Term labor symptoms and general obstetric precautions including but not limited to vaginal bleeding, contractions, leaking of fluid and fetal movement were  reviewed in detail with the patient. Please refer to After Visit Summary for other counseling recommendations.   Return in 4 weeks (on 06/29/2019).  Future Appointments  Date Time Provider [place]  06/29/2019  9:45 AM Luvenia Redden, PA-C [facility] None    Donnamae Jude, MD

## 2019-06-05 MED ORDER — SODIUM CHLORIDE 0.9 % IV SOLN: 1000.0000 mg | Freq: Once | INTRAVENOUS | Status: DC

## 2019-06-05 MED ORDER — IBUPROFEN 400 MG PO TABS: 400.0000 mg | ORAL_TABLET | Freq: Three times a day (TID) | ORAL | 0 refills | Status: AC

## 2019-06-05 MED ORDER — OXYCODONE HCL 5 MG PO TABS: 5.0000 mg | ORAL_TABLET | ORAL | 0 refills | Status: AC | PRN

## 2019-06-05 MED ORDER — SODIUM CHLORIDE 0.9 % IV SOLN
1000.0000 mg | Freq: Once | INTRAVENOUS | Status: AC
  Administered 2019-06-05: 1000 mg via INTRAVENOUS
  Filled 2019-06-05 (×2): qty 20

## 2019-06-05 MED ORDER — SODIUM CHLORIDE 0.9 % IV SOLN
25.0000 mg | Freq: Once | INTRAVENOUS | Status: AC
  Administered 2019-06-05: 25 mg via INTRAVENOUS
  Filled 2019-06-05: qty 0.5

## 2019-06-05 MED ORDER — FERROUS SULFATE 325 (65 FE) MG PO TABS: 325.0000 mg | ORAL_TABLET | Freq: Every day | ORAL | Status: DC

## 2019-06-05 MED ORDER — FERROUS SULFATE 325 (65 FE) MG PO TABS: 325.0000 mg | ORAL_TABLET | ORAL | 1 refills | Status: AC

## 2019-06-05 MED ORDER — NORETHINDRONE 0.35 MG PO TABS: 1.0000 | ORAL_TABLET | Freq: Every day | ORAL | 11 refills | Status: AC

## 2019-06-05 NOTE — Plan of Care (Signed)
Progressing

## 2019-06-05 NOTE — Discharge Summary (Signed)
Postpartum Discharge Summary     Patient Name: Victoria Glass DOB: [birth year] MRN: [id number]  Date of admission: 06/02/2019 Delivering Provider: Lazaro ArmsEURE, LUTHER H   Date of discharge: 06/05/2019  Admitting diagnosis: 40 WKS, CTX Intrauterine pregnancy: 3138w4d     Secondary diagnosis:  Active Problems:   Encounter for induction of labor  Additional problems: NRFHT resulting in PLTCS. Anemia     Discharge diagnosis: Anemia                                                                                                Post partum procedures: Iron Dextran Infusion  Augmentation: AROM and Pitocin  Complications: None  Hospital course:  Onset of Labor With Unplanned C/S  28 y.o. yo G2P1011 at 2138w4d was admitted in Latent Labor on 06/02/2019. Patient had a labor course significant for SOL. Membrane Rupture Time/Date: 2:31 PM ,06/02/2019 . Patient had AROM performed and was given Pitocin. No cervical change in 12 hours, inadequate labor on pitocin was stopped earlier due to fetal intolerance, pt desires to proceed with primary Caesarean section. The patient went for cesarean section due to Arrest of Dilation, Arrest of Descent and Non-Reassuring FHR, and delivered a Viable infant,06/03/2019. Patient with anemia post-partum and was given Iron Dextran prior to discharge and prescribed ferrous sulfate on discharge. POP's prescribed prior to discharge.  Details of operation can be found in separate operative note. Patient had an uncomplicated postpartum course.  She is ambulating,tolerating a regular diet, passing flatus, and urinating well.  Patient is discharged home in stable condition 06/05/19.  Magnesium Sulfate recieved: No BMZ received: No  Physical exam  Vitals:   06/04/19 0520 06/04/19 1410 06/04/19 2319 06/05/19 0514  BP: 101/62 103/77 115/65 113/73  Pulse: (!) 108 100 90 88  Resp: 18 20 18 18   Temp: 98.1 F (36.7 C) 99 F (37.2 C) 98.5 F (36.9 C) 98.3 F (36.8 C)  TempSrc:  Oral Oral Oral Oral  SpO2: 98% 96%    Weight:      Height:       General: alert, cooperative and no distress Lochia: appropriate Uterine Fundus: firm Incision: Healing well with no significant drainage DVT Evaluation: No evidence of DVT seen on physical exam. Labs: Lab Results  Component Value Date   WBC 15.0 (H) 06/04/2019   HGB 7.6 (L) 06/04/2019   HCT 23.4 (L) 06/04/2019   MCV 91.1 06/04/2019   PLT 256 06/04/2019   CMP Latest Ref Rng & Units 01/01/2016  Glucose 65 - 99 mg/dL 86  BUN 6 - 20 mg/dL 11  Creatinine 7.820.57 - 9.561.00 mg/dL 2.130.85  Sodium 086134 - 578144 mmol/L 139  Potassium 3.5 - 5.2 mmol/L 4.3  Chloride 96 - 106 mmol/L 102  CO2 18 - 29 mmol/L 20  Calcium 8.7 - 10.2 mg/dL 9.2  Total Protein 6.0 - 8.5 g/dL 6.9  Total Bilirubin 0.0 - 1.2 mg/dL 0.5  Alkaline Phos 39 - 117 IU/L 66  AST 0 - 40 IU/L 21  ALT 0 - 32 IU/L 22    Discharge instruction: per After Visit Summary and "  Baby and Me Booklet".  After visit meds:  Allergies as of 06/05/2019   No Known Allergies     Medication List    TAKE these medications   acetaminophen 500 MG tablet Commonly known as: TYLENOL Take 500 mg by mouth every 6 (six) hours as needed for mild pain or moderate pain.   ferrous sulfate 325 (65 FE) MG tablet Commonly known as: FerrouSul Take 1 tablet (325 mg total) by mouth every other day.   norethindrone 0.35 MG tablet Commonly known as: MICRONOR Take 1 tablet (0.35 mg total) by mouth daily.   Prenatal Gummies/DHA & FA 0.4-32.5 MG Chew Chew by mouth.       Diet: routine diet  Activity: Advance as tolerated. Pelvic rest for 6 weeks.   Outpatient follow up:4 weeks Follow up Appt: Future Appointments  Date Time Provider [place]  06/29/2019  9:45 AM Luvenia Redden, PA-C [facility] None   Follow up Visit:   Please schedule this patient for Postpartum visit in: 4 weeks with the following provider: MD For C/S patients schedule nurse incision check in weeks 2 weeks:  yes Low risk pregnancy complicated by: Anemia. PLTCS Delivery mode:  CS Anticipated Birth Control:  POPs PP Procedures needed: Incision check  Schedule Integrated BH visit: no      Newborn Data: Live born female  Birth Weight: 8 lb 9.2 oz (3890 g) APGAR: 9, 9  Newborn Delivery   Birth date/time: 06/03/2019 09:35:00 Delivery type: C-Section, Low Transverse Trial of labor: Yes C-section categorization: Primary      Baby Feeding: Breast Disposition:home with mother   06/05/2019 Chauncey Mann, MD

## 2019-06-05 NOTE — Plan of Care (Signed)
Ready for D/C

## 2019-06-05 NOTE — Lactation Note (Signed)
This note was copied from a baby's chart. Lactation Consultation Note  Patient Name: [name] [id number] Date: 06/05/2019 Reason for consult: Follow-up assessment;Term;Infant weight loss Baby is 7 hours old / Serum  Bili 11.4  As LC entered the room mom holding baby wrapped in a blanket .  Per mom baby last  Fed at 1:00 p for 35 mins with swallows.  Mom mentioned the bili was up and they plan a repeat. LC recommended if the bili is higher when rechecked and the baby is placed on photo tx - to ask the [facility] to set up the DEBP for post pumping. In the mean time prior to latch - breast massage, hand express, pre-pump with the hand pump to prime milk ducts and enhance let down.  What ever  EBM obtained can be given back to the baby.  Mom aware to call if she needs breast feeding help.      Maternal Data    Feeding Feeding Type: (per mom baby last fed at 1300 with swallows)  LATCH Score ( Latch score by the [employer] earlier )  Latch: Repeated attempts needed to sustain latch, nipple held in mouth throughout feeding, stimulation needed to elicit sucking reflex.  Audible Swallowing: Spontaneous and intermittent  Type of Nipple: Everted at rest and after stimulation  Comfort (Breast/Nipple): Soft / non-tender  Hold (Positioning): No assistance needed to correctly position infant at breast.  LATCH Score: 9  Interventions Interventions: Breast feeding basics reviewed  Lactation Tools Discussed/Used Tools: Pump Breast pump type: Manual   Consult Status Consult Status: Follow-up Date: 06/06/19 Follow-up type: In-patient    [place] 06/05/2019, 3:40 PM

## 2019-06-05 NOTE — Progress Notes (Signed)
Dr. Darene Lamer notified that the patient's IV line during the Iron infusion was leaking. So Infusion was stopped.  Patient does not want to get stuck again. Only half of the dose went in. Patient tolerated what she has received well. Patient will be educated on if she feels symptomatic to go to the ER.

## 2019-06-05 NOTE — Progress Notes (Signed)
25 mg IV Iron dose in and patient monitored for 15 minutes. No reaction per patient will proceed to next dose.

## 2019-06-06 ENCOUNTER — Inpatient Hospital Stay (HOSPITAL_COMMUNITY): Payer: BC Managed Care – PPO

## 2019-06-20 ENCOUNTER — Telehealth: Payer: Self-pay

## 2019-06-20 NOTE — Telephone Encounter (Signed)
Pt called and reports she is having swelling in both ankles. Pt is postpartum C/S. Pt denies any other symptoms at this time. Pt has appt scheduled for tomorrow and I advised patient to discuss this with the provider tomorrow at her visit. Pt verbalized understanding.

## 2019-06-21 ENCOUNTER — Encounter: Payer: Self-pay | Admitting: Family Medicine

## 2019-06-21 ENCOUNTER — Other Ambulatory Visit: Payer: Self-pay

## 2019-06-21 ENCOUNTER — Ambulatory Visit (INDEPENDENT_AMBULATORY_CARE_PROVIDER_SITE_OTHER): Payer: BC Managed Care – PPO | Admitting: Family Medicine

## 2019-06-21 VITALS — BP 117/76 | HR 89 | Ht 63.0 in | Wt 199.0 lb

## 2019-06-21 DIAGNOSIS — Z9889 Other specified postprocedural states: Secondary | ICD-10-CM

## 2019-06-21 DIAGNOSIS — Z4889 Encounter for other specified surgical aftercare: Secondary | ICD-10-CM

## 2019-06-21 NOTE — Progress Notes (Signed)
3 Weeks PP presents for incision check, reports no problems with incision.  C/o swollen ankles and feet.  Denies HA, blurry vision or dizziness.

## 2019-06-21 NOTE — Progress Notes (Signed)
   Subjective:    Patient ID: Victoria Glass, female    DOB: 07/26/1991, 28 y.o.   MRN: [id number]  HPI Seen for incision check. Has some dependent edema. No other concerns or questions   Review of Systems     Objective:   Physical Exam Constitutional:      Appearance: Normal appearance.  Abdominal:     Comments: Incision clean, dry, intact  Neurological:     Mental Status: She is alert.       Assessment & Plan:  1. Encounter for post surgical wound check Follow up for PP visit in 2 weeks.

## 2019-06-29 ENCOUNTER — Ambulatory Visit (INDEPENDENT_AMBULATORY_CARE_PROVIDER_SITE_OTHER): Payer: BC Managed Care – PPO | Admitting: Internal Medicine

## 2019-06-29 ENCOUNTER — Other Ambulatory Visit: Payer: Self-pay

## 2019-06-29 DIAGNOSIS — Z1389 Encounter for screening for other disorder: Secondary | ICD-10-CM

## 2019-06-29 NOTE — Patient Instructions (Signed)
Postpartum Care After Cesarean Delivery °This sheet gives you information about how to care for yourself from the time you deliver your baby to up to 6-12 weeks after delivery (postpartum period). Your health care provider may also give you more specific instructions. If you have problems or questions, contact your health care provider. °Follow these instructions at home: °Medicines °· Take over-the-counter and prescription medicines only as told by your health care provider. °· If you were prescribed an antibiotic medicine, take it as told by your health care provider. Do not stop taking the antibiotic even if you start to feel better. °· Ask your health care provider if the medicine prescribed to you: °? Requires you to avoid driving or using heavy machinery. °? Can cause constipation. You may need to take actions to prevent or treat constipation, such as: °§ Drink enough fluid to keep your urine pale yellow. °§ Take over-the-counter or prescription medicines. °§ Eat foods that are high in fiber, such as beans, whole grains, and fresh fruits and vegetables. °§ Limit foods that are high in fat and processed sugars, such as fried or sweet foods. °Activity °· Gradually return to your normal activities as told by your health care provider. °· Avoid activities that take a lot of effort and energy (are strenuous) until approved by your health care provider. Walking at a slow to moderate pace is usually safe. Ask your health care provider what activities are safe for you. °? Do not lift anything that is heavier than your baby or 10 lb (4.5 kg) as told by your health care provider. °? Do not vacuum, climb stairs, or drive a car for as long as told by your health care provider. °· If possible, have someone help you at home until you are able to do your usual activities yourself. °· Rest as much as possible. Try to rest or take naps while your baby is sleeping. °Vaginal bleeding °· It is normal to have vaginal bleeding  (lochia) after delivery. Wear a sanitary pad to absorb vaginal bleeding and discharge. °? During the first week after delivery, the amount and appearance of lochia is often similar to a menstrual period. °? Over the next few weeks, it will gradually decrease to a dry, yellow-brown discharge. °? For most women, lochia stops completely by 4-6 weeks after delivery. Vaginal bleeding can vary from woman to woman. °· Change your sanitary pads frequently. Watch for any changes in your flow, such as: °? A sudden increase in volume. °? A change in color. °? Large blood clots. °· If you pass a blood clot, save it and call your health care provider to discuss. Do not flush blood clots down the toilet before you get instructions from your health care provider. °· Do not use tampons or douches until your health care provider says this is safe. °· If you are not breastfeeding, your period should return 6-8 weeks after delivery. If you are breastfeeding, your period may return anytime between 8 weeks after delivery and the time that you stop breastfeeding. °Perineal care ° °· If your C-section (Cesarean section) was unplanned, and you were allowed to labor and push before delivery, you may have pain, swelling, and discomfort of the tissue between your vaginal opening and your anus (perineum). You may also have an incision in the tissue (episiotomy) or the tissue may have torn during delivery. Follow these instructions as told by your health care provider: °? Keep your perineum clean and dry as told by   your health care provider. Use medicated pads and pain-relieving sprays and creams as directed. °? If you have an episiotomy or vaginal tear, check the area every day for signs of infection. Check for: °§ Redness, swelling, or pain. °§ Fluid or blood. °§ Warmth. °§ Pus or a bad smell. °? You may be given a squirt bottle to use instead of wiping to clean the perineum area after you go to the bathroom. As you start healing, you may use  the squirt bottle before wiping yourself. Make sure to wipe gently. °? To relieve pain caused by an episiotomy, vaginal tear, or hemorrhoids, try taking a warm sitz bath 2-3 times a day. A sitz bath is a warm water bath that is taken while you are sitting down. The water should only come up to your hips and should cover your buttocks. °Breast care °· Within the first few days after delivery, your breasts may feel heavy, full, and uncomfortable (breast engorgement). You may also have milk leaking from your breasts. Your health care provider can suggest ways to help relieve breast discomfort. Breast engorgement should go away within a few days. °· If you are breastfeeding: °? Wear a bra that supports your breasts and fits you well. °? Keep your nipples clean and dry. Apply creams and ointments as told by your health care provider. °? You may need to use breast pads to absorb milk leakage. °? You may have uterine contractions every time you breastfeed for several weeks after delivery. Uterine contractions help your uterus return to its normal size. °? If you have any problems with breastfeeding, work with your health care provider or a lactation consultant. °· If you are not breastfeeding: °? Avoid touching your breasts as this can make your breasts produce more milk. °? Wear a well-fitting bra and use cold packs to help with swelling. °? Do not squeeze out (express) milk. This causes you to make more milk. °Intimacy and sexuality °· Ask your health care provider when you can engage in sexual activity. This may depend on your: °? Risk of infection. °? Healing rate. °? Comfort and desire to engage in sexual activity. °· You are able to get pregnant after delivery, even if you have not had your period. If desired, talk with your health care provider about methods of family planning or birth control (contraception). °Lifestyle °· Do not use any products that contain nicotine or tobacco, such as cigarettes, e-cigarettes,  and chewing tobacco. If you need help quitting, ask your health care provider. °· Do not drink alcohol, especially if you are breastfeeding. °Eating and drinking ° °· Drink enough fluid to keep your urine pale yellow. °· Eat high-fiber foods every day. These may help prevent or relieve constipation. High-fiber foods include: °? Whole grain cereals and breads. °? Brown rice. °? Beans. °? Fresh fruits and vegetables. °· Take your prenatal vitamins until your postpartum checkup or until your health care provider tells you it is okay to stop. °General instructions °· Keep all follow-up visits for you and your baby as told by your health care provider. Most women visit their health care provider for a postpartum checkup within the first 3-6 weeks after delivery. °Contact a health care provider if you: °· Feel unable to cope with the changes that a new baby brings to your life, and these feelings do not go away. °· Feel unusually sad or worried. °· Have breasts that are painful, hard, or turn red. °· Have a fever. °·   Have trouble holding urine or keeping urine from leaking. °· Have little or no interest in activities you used to enjoy. °· Have not breastfed at all and you have not had a menstrual period for 12 weeks after delivery. °· Have stopped breastfeeding and you have not had a menstrual period for 12 weeks after you stopped breastfeeding. °· Have questions about caring for yourself or your baby. °· Pass a blood clot from your vagina. °Get help right away if you: °· Have chest pain. °· Have difficulty breathing. °· Have sudden, severe leg pain. °· Have severe pain or cramping in your abdomen. °· Bleed from your vagina so much that you fill more than one sanitary pad in one hour. Bleeding should not be heavier than your heaviest period. °· Develop a severe headache. °· Faint. °· Have blurred vision or spots in your vision. °· Have a bad-smelling vaginal discharge. °· Have thoughts about hurting yourself or your  baby. °If you ever feel like you may hurt yourself or others, or have thoughts about taking your own life, get help right away. You can go to your nearest emergency department or call: °· Your local emergency services (911 in the [place] °· A suicide crisis helpline, such as the National Suicide Prevention Lifeline at 1-800-273-8255. This is open 24 hours a day. °Summary °· The period of time from when you deliver your baby to up to 6-12 weeks after delivery is called the postpartum period. °· Gradually return to your normal activities as told by your health care provider. °· Keep all follow-up visits for you and your baby as told by your health care provider. °This information is not intended to replace advice given to you by your health care provider. Make sure you discuss any questions you have with your health care provider. °Document Released: 09/24/2000 Document Revised: 05/17/2018 Document Reviewed: 05/17/2018 °Elsevier Patient Education © 2020 [employer] ° °

## 2019-06-29 NOTE — Progress Notes (Signed)
Post Partum Exam  Victoria Glass is a 28 y.o. [id number] female who presents for a postpartum visit. She is 3 weeks postpartum following a low cervical transverse Cesarean section. I have fully reviewed the prenatal and intrapartum course. The delivery was at 40+ gestational weeks.  Anesthesia: spinal. Postpartum course has been doing well. Baby's course has been doing well. Baby is feeding by both breast and bottle - Enfamil Gentle. Bleeding no bleeding. Bowel function is normal. Bladder function is normal. Patient is not sexually active. Contraception method is Micronor and abstinencePostpartum depression screening:neg, score 0.  The following portions of the patient's history were reviewed and updated as appropriate: allergies, current medications, past family history, past medical history, past social history, past surgical history and problem list. Last pap smear done Jan 2020 and was Normal  Review of Systems Pertinent items are noted in HPI.    Objective:  Blood pressure 111/73, pulse 84, weight 87.5 kg, last menstrual period 08/23/2018, currently breastfeeding.  General:  alert and cooperative  Lungs: clear to auscultation bilaterally  Heart:  regular rate and rhythm, S1, S2 normal, no murmur, click, rub or gallop; Negative Homan's sign bilaterally.   Abdomen: soft, non-tender; bowel sounds normal; no masses,  no organomegaly; incision is intact and healing well without erythema or drainage   Psych:  Normal mood and affect   Skin: Warm and dry. No LE edema.         Assessment:    Normal postpartum exam. Pap smear not done at today's visit.   Plan:   1. Contraception: oral progesterone-only contraceptive 2.Follow up in: 1 year for annual exam or as needed.

## 2020-07-31 IMAGING — US US OB COMP LESS 14 WK
1 series · 15 of 28 positions shown · non-contrast
Comparison: None for this gestation

CLINICAL DATA: Supervision of other wise normal pregnancy
antepartum, dating, question multiple gestations

EXAM:
OBSTETRIC <14 WK ULTRASOUND
TECHNIQUE: Transabdominal ultrasound was performed for evaluation of the
gestation as well as the maternal uterus and adnexal regions.

[Series 1: us ob comp less 14 wk · 15 of 47 slices shown]
[im 1/47]
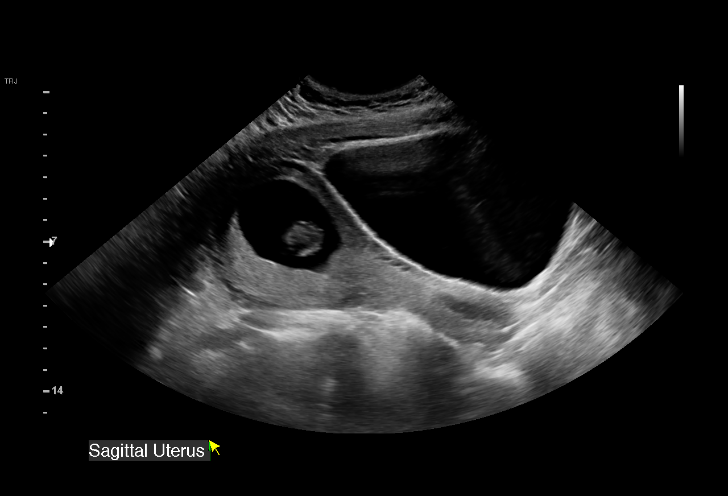
[im 4/47]
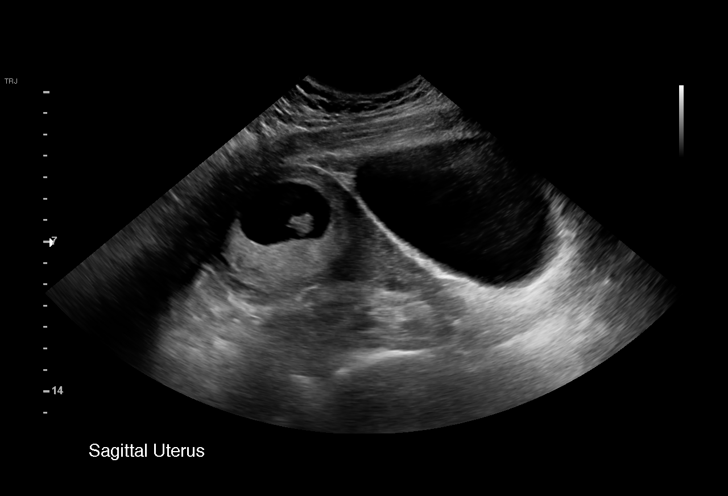
[im 7/47]
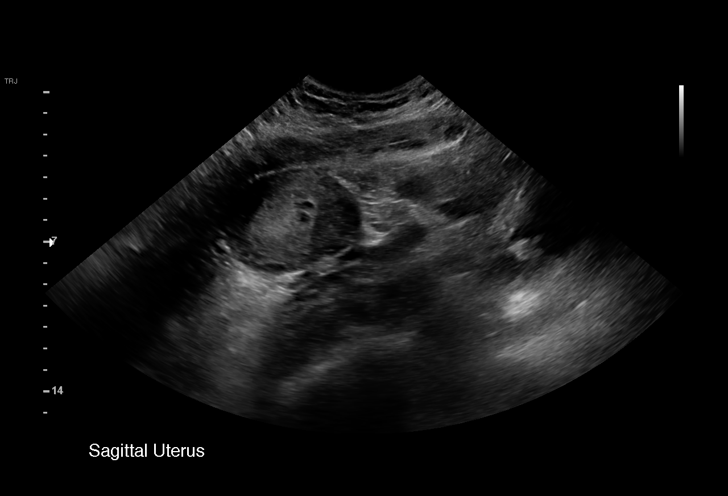
[im 11/47]
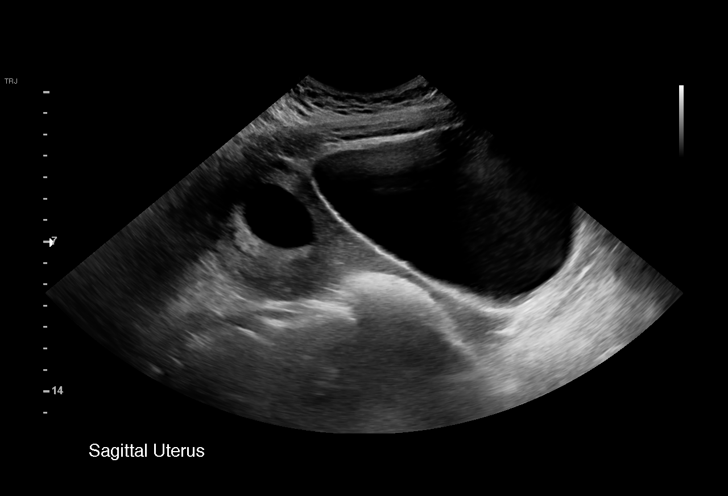
[im 14/47]
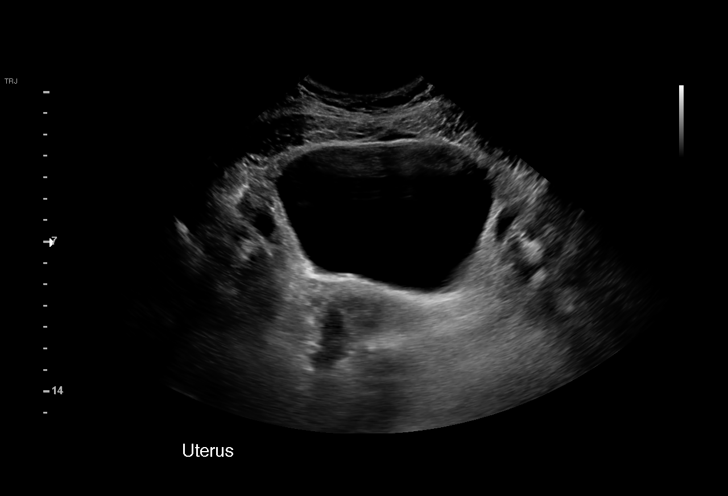
[im 18/47]
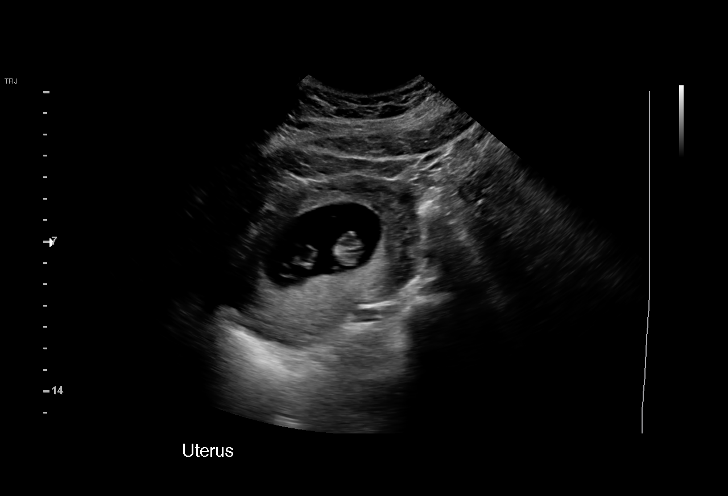
[im 21/47]
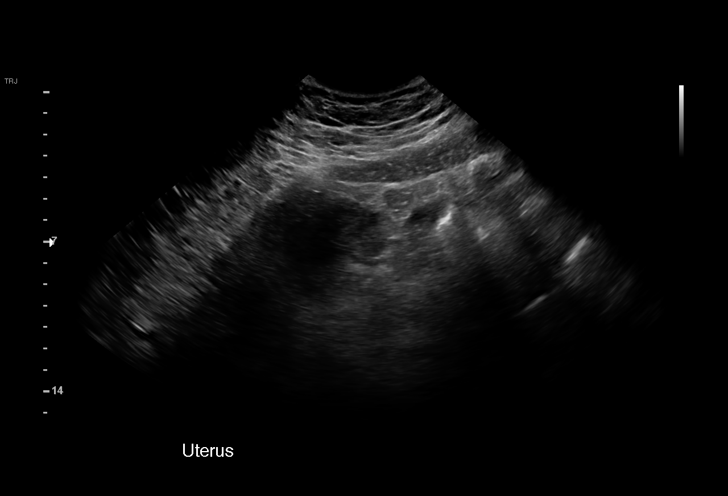
[im 24/47]
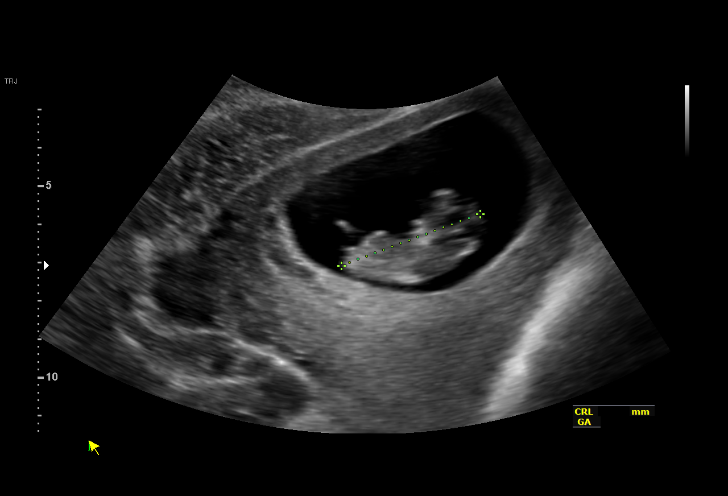
[im 26/47]
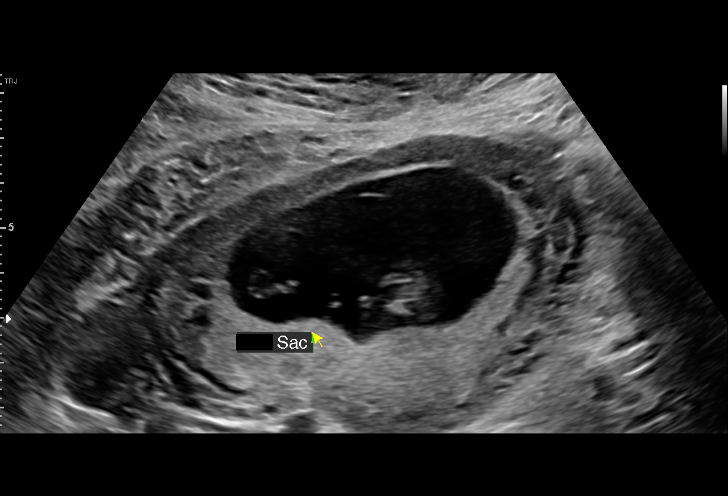
[im 29/47]
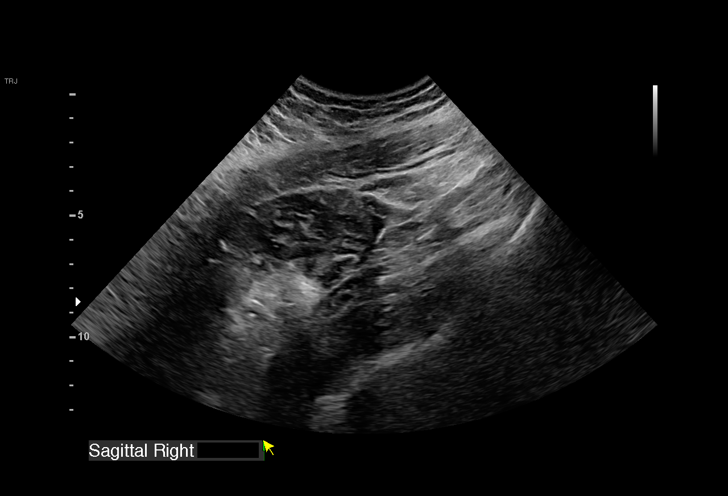
[im 33/47]
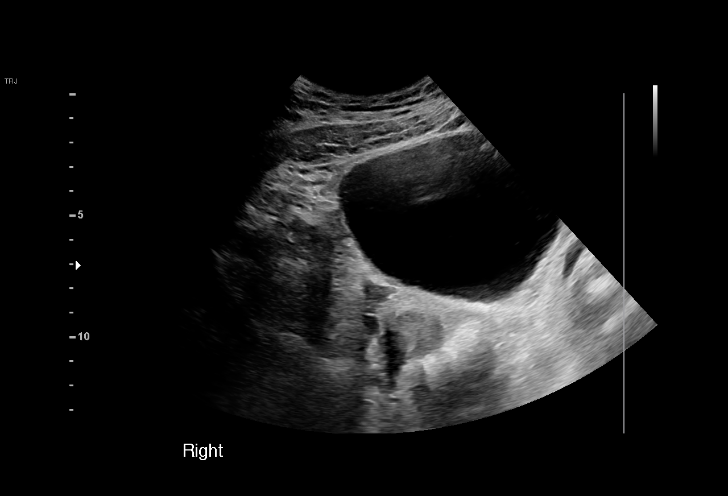
[im 36/47]
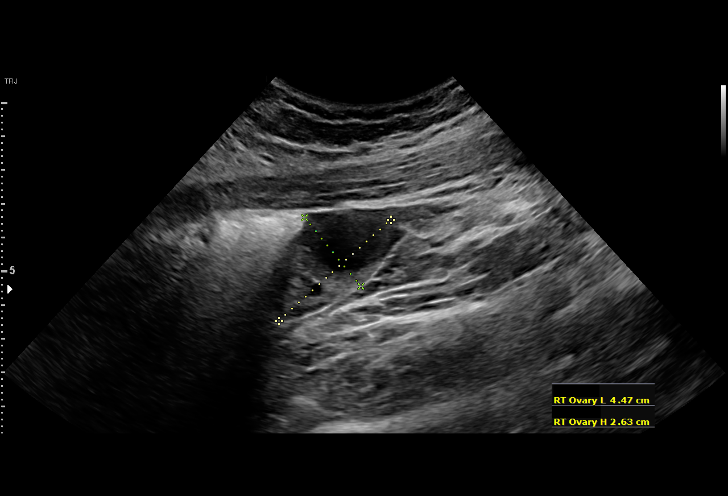
[im 40/47]
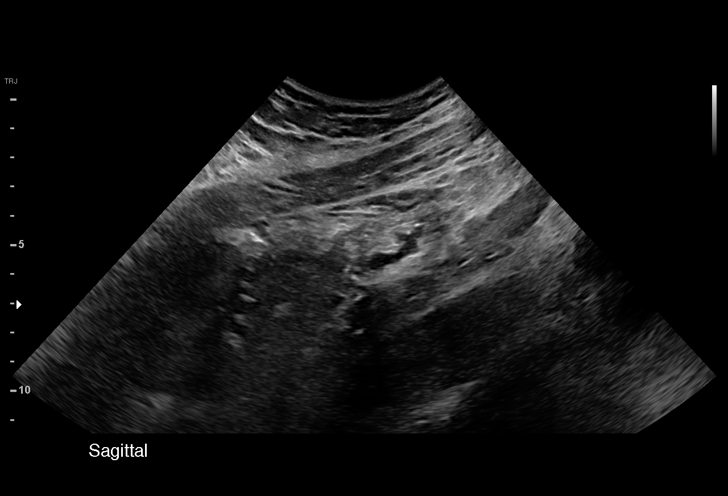
[im 43/47]
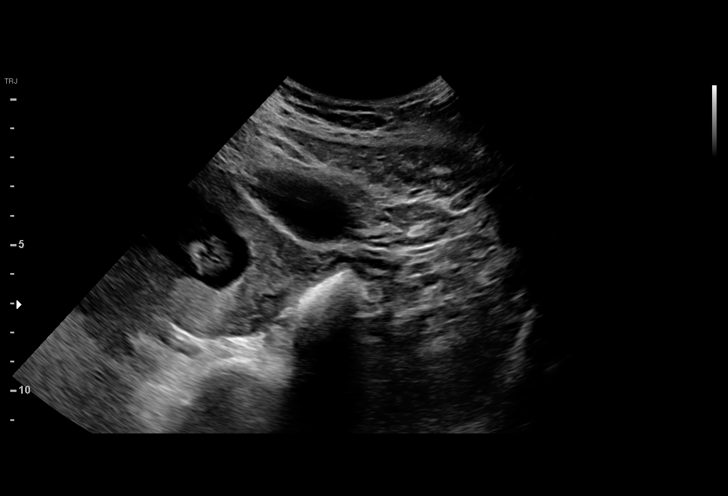
[im 47/47]
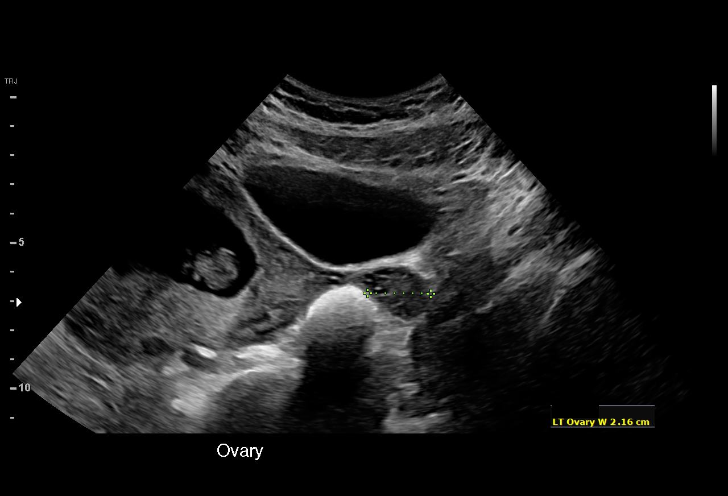

[15 of 28 positions shown; findings below may reference images not displayed]

FINDINGS: Intrauterine gestational sac: Present, single

Yolk sac:  Present

Embryo:  Present

Cardiac Activity: Present

Heart Rate: 161 bpm

CRL:   38.1 mm   10 w 5 d                  US EDC: 06/01/2019

Subchorionic hemorrhage:  None visualized.

Maternal uterus/adnexae:

RIGHT ovary measures 4.5 x 2.6 x 2.7 cm and contains a small corpus
luteal cyst. LEFT ovary normal size and morphology, a 3.0 x 1.4 x
2.2 cm.

No free pelvic fluid or adnexal masses.
IMPRESSION: Single live intrauterine gestation at 10 weeks 5 days EGA by
crown-rump length.

No acute abnormalities.
